# Patient Record
Sex: Female | Born: 1945 | Race: White | Hispanic: No | Marital: Married | Smoking: Never smoker
Health system: Southern US, Community
[De-identification: ages and names within clinical notes are randomized; demographics above are authoritative.]

---

## 2000-07-29 ENCOUNTER — Other Ambulatory Visit: Admission: RE | Admit: 2000-07-29 | Discharge: 2000-07-29 | Payer: Self-pay | Admitting: Podiatry

## 2001-01-07 ENCOUNTER — Encounter: Payer: Self-pay | Admitting: Obstetrics and Gynecology

## 2001-01-07 ENCOUNTER — Encounter: Admission: RE | Admit: 2001-01-07 | Discharge: 2001-01-07 | Payer: Self-pay | Admitting: Obstetrics and Gynecology

## 2001-01-12 ENCOUNTER — Encounter: Payer: Self-pay | Admitting: Obstetrics and Gynecology

## 2001-01-12 ENCOUNTER — Encounter: Admission: RE | Admit: 2001-01-12 | Discharge: 2001-01-12 | Payer: Self-pay | Admitting: Obstetrics and Gynecology

## 2001-01-27 ENCOUNTER — Encounter: Admission: RE | Admit: 2001-01-27 | Discharge: 2001-01-27 | Payer: Self-pay | Admitting: Obstetrics and Gynecology

## 2001-01-27 ENCOUNTER — Encounter: Payer: Self-pay | Admitting: Obstetrics and Gynecology

## 2001-09-17 ENCOUNTER — Encounter: Admission: RE | Admit: 2001-09-17 | Discharge: 2001-09-17 | Payer: Self-pay | Admitting: Orthopaedic Surgery

## 2001-09-17 ENCOUNTER — Encounter: Payer: Self-pay | Admitting: Orthopaedic Surgery

## 2002-01-13 ENCOUNTER — Encounter: Payer: Self-pay | Admitting: Obstetrics and Gynecology

## 2002-01-13 ENCOUNTER — Encounter: Admission: RE | Admit: 2002-01-13 | Discharge: 2002-01-13 | Payer: Self-pay | Admitting: Obstetrics and Gynecology

## 2003-01-17 ENCOUNTER — Encounter: Payer: Self-pay | Admitting: Obstetrics and Gynecology

## 2003-01-17 ENCOUNTER — Encounter: Admission: RE | Admit: 2003-01-17 | Discharge: 2003-01-17 | Payer: Self-pay | Admitting: Obstetrics and Gynecology

## 2004-01-18 ENCOUNTER — Encounter: Admission: RE | Admit: 2004-01-18 | Discharge: 2004-01-18 | Payer: Self-pay | Admitting: Obstetrics and Gynecology

## 2005-01-16 ENCOUNTER — Encounter: Admission: RE | Admit: 2005-01-16 | Discharge: 2005-01-16 | Payer: Self-pay | Admitting: Obstetrics and Gynecology

## 2006-01-14 ENCOUNTER — Encounter: Admission: RE | Admit: 2006-01-14 | Discharge: 2006-01-14 | Payer: Self-pay

## 2007-01-13 ENCOUNTER — Encounter: Admission: RE | Admit: 2007-01-13 | Discharge: 2007-01-13 | Payer: Self-pay | Admitting: Unknown Physician Specialty

## 2008-01-17 ENCOUNTER — Encounter: Admission: RE | Admit: 2008-01-17 | Discharge: 2008-01-17 | Payer: Self-pay | Admitting: Obstetrics and Gynecology

## 2009-01-11 ENCOUNTER — Encounter: Admission: RE | Admit: 2009-01-11 | Discharge: 2009-01-11 | Payer: Self-pay | Admitting: Obstetrics and Gynecology

## 2010-01-10 ENCOUNTER — Encounter: Admission: RE | Admit: 2010-01-10 | Discharge: 2010-01-10 | Payer: Self-pay | Admitting: Obstetrics and Gynecology

## 2011-01-21 ENCOUNTER — Other Ambulatory Visit: Payer: Self-pay | Admitting: Obstetrics and Gynecology

## 2011-01-21 DIAGNOSIS — Z1231 Encounter for screening mammogram for malignant neoplasm of breast: Secondary | ICD-10-CM

## 2011-01-27 ENCOUNTER — Ambulatory Visit
Admission: RE | Admit: 2011-01-27 | Discharge: 2011-01-27 | Disposition: A | Discharge status: Home / Self Care | Payer: Medicare Other | Source: Ambulatory Visit | Attending: Obstetrics and Gynecology | Admitting: Obstetrics and Gynecology

## 2011-01-27 DIAGNOSIS — Z1231 Encounter for screening mammogram for malignant neoplasm of breast: Secondary | ICD-10-CM

## 2012-01-28 ENCOUNTER — Other Ambulatory Visit: Payer: Self-pay | Admitting: Obstetrics and Gynecology

## 2012-01-28 DIAGNOSIS — Z1231 Encounter for screening mammogram for malignant neoplasm of breast: Secondary | ICD-10-CM

## 2012-02-17 ENCOUNTER — Ambulatory Visit
Admission: RE | Admit: 2012-02-17 | Discharge: 2012-02-17 | Disposition: A | Discharge status: Home / Self Care | Payer: Medicare Other | Source: Ambulatory Visit | Attending: Obstetrics and Gynecology | Admitting: Obstetrics and Gynecology

## 2012-02-17 DIAGNOSIS — Z1231 Encounter for screening mammogram for malignant neoplasm of breast: Secondary | ICD-10-CM

## 2013-08-31 ENCOUNTER — Other Ambulatory Visit: Payer: Self-pay

## 2013-08-31 DIAGNOSIS — Z1231 Encounter for screening mammogram for malignant neoplasm of breast: Secondary | ICD-10-CM

## 2013-09-23 ENCOUNTER — Ambulatory Visit
Admission: RE | Admit: 2013-09-23 | Discharge: 2013-09-23 | Disposition: A | Discharge status: Home / Self Care | Payer: Self-pay | Source: Ambulatory Visit

## 2013-09-23 DIAGNOSIS — Z1231 Encounter for screening mammogram for malignant neoplasm of breast: Secondary | ICD-10-CM

## 2017-05-15 ENCOUNTER — Ambulatory Visit (INDEPENDENT_AMBULATORY_CARE_PROVIDER_SITE_OTHER): Payer: Medicare Other | Admitting: Sports Medicine

## 2017-05-15 ENCOUNTER — Ambulatory Visit (INDEPENDENT_AMBULATORY_CARE_PROVIDER_SITE_OTHER): Payer: Medicare Other

## 2017-05-15 DIAGNOSIS — M722 Plantar fascial fibromatosis: Secondary | ICD-10-CM

## 2017-05-15 DIAGNOSIS — M25461 Effusion, right knee: Secondary | ICD-10-CM

## 2017-05-15 DIAGNOSIS — M67471 Ganglion, right ankle and foot: Secondary | ICD-10-CM | POA: Diagnosis not present

## 2017-05-15 DIAGNOSIS — M7041 Prepatellar bursitis, right knee: Secondary | ICD-10-CM | POA: Insufficient documentation

## 2017-05-15 DIAGNOSIS — M25561 Pain in right knee: Secondary | ICD-10-CM | POA: Diagnosis not present

## 2017-05-15 MED ORDER — MELOXICAM 15 MG PO TABS: ORAL_TABLET | ORAL | 3 refills | Status: DC

## 2017-05-15 NOTE — Assessment & Plan Note (Signed)
Aspiration and injection. Strapped with compressive dressing. Return in 1 month for this.

## 2017-05-15 NOTE — Progress Notes (Signed)
Subjective:    I'm seeing this patient as a consultation for: Dr. Cathrine MusterHolly Ivey  CC: Right knee pain, ankle pain, foot pain  HPI: Right knee pain: Larey SeatFell about a month ago, had some pain that is improving over the anterior aspect of her patella.  Only minimal swelling, no other issues.  Right foot pain: Localized on the plantar aspect of the heel, present for months to years, worse with the first few steps in the morning, severe, persistent.  No treatment done yet.  Right foot swelling: Localized over the dorsum, history of ganglion cyst removal in the distant past, now has another ganglion cyst somewhat proximal.  It is fairly bothersome with pain when lacing her shoes.  Past medical history, Surgical history, Family history not pertinant except as noted below, Social history, Allergies, and medications have been entered into the medical record, reviewed, and no changes needed.   Review of Systems: No headache, visual changes, nausea, vomiting, diarrhea, constipation, dizziness, abdominal pain, skin rash, fevers, chills, night sweats, weight loss, swollen lymph nodes, body aches, joint swelling, muscle aches, chest pain, shortness of breath, mood changes, visual or auditory hallucinations.   Objective:   General: Well Developed, well nourished, and in no acute distress.  Neuro:  Extra-ocular muscles intact, able to move all 4 extremities, sensation grossly intact.  Deep tendon reflexes tested were normal. Psych: Alert and oriented, mood congruent with affect. ENT:  Ears and nose appear unremarkable.  Hearing grossly normal. Neck: Unremarkable overall appearance, trachea midline.  No visible thyroid enlargement. Eyes: Conjunctivae and lids appear unremarkable.  Pupils equal and round. Skin: Warm and dry, no rashes noted.  Cardiovascular: Pulses palpable, no extremity edema. Right knee: Slight swelling with tenderness over the prepatellar bursa ROM normal in flexion and extension and lower  leg rotation. Ligaments with solid consistent endpoints including ACL, PCL, LCL, MCL. Negative Mcmurray's and provocative meniscal tests. Non painful patellar compression. Patellar and quadriceps tendons unremarkable. Hamstring and quadriceps strength is normal. Right ankle: No visible erythema or swelling. Palpable dorsal ganglion cyst overlying the talonavicular joint Range of motion is full in all directions. Strength is 5/5 in all directions. Stable lateral and medial ligaments; squeeze test and kleiger test unremarkable; Talar dome nontender; No pain at base of 5th MT; No tenderness over cuboid; No tenderness over N spot or navicular prominence No tenderness on posterior aspects of lateral and medial malleolus No sign of peroneal tendon subluxations; Negative tarsal tunnel tinel's Able to walk 4 steps. Right foot: No visible erythema or swelling. Range of motion is full in all directions. Strength is 5/5 in all directions. No hallux valgus. No pes cavus or pes planus. No abnormal callus noted. No pain over the navicular prominence, or base of fifth metatarsal. Severe tenderness to palpation of the calcaneal insertion of plantar fascia. No pain at the Achilles insertion. No pain over the calcaneal bursa. No pain of the retrocalcaneal bursa. No tenderness to palpation over the tarsals, metatarsals, or phalanges. No hallux rigidus or limitus. No tenderness palpation over interphalangeal joints. No pain with compression of the metatarsal heads. Neurovascularly intact distally.  Procedure: Real-time Ultrasound Guided aspiration/injection of right talonavicular ganglion cyst Device: GE Logiq E  Verbal informed consent obtained.  Time-out conducted.  Noted no overlying erythema, induration, or other signs of local infection.  Skin prepped in a sterile fashion.  Local anesthesia: Topical Ethyl chloride.  With sterile technique and under real time ultrasound guidance:  Identified the course of the dorsalis  pedis, ganglion cyst lies lateral, using an 18-gauge needle advanced into the cyst and aspirated a scant amount of thick yellowish fluid, syringe switched and 1/2 cc Kenalog 40, 1/2 cc lidocaine injected easily Completed without difficulty  Pain immediately resolved suggesting accurate placement of the medication.  Advised to call if fevers/chills, erythema, induration, drainage, or persistent bleeding.  Images permanently stored and available for review in the ultrasound unit.  Impression: Technically successful ultrasound guided injection.  Foot and ankle were then strapped with a compressive dressing.  Impression and Recommendations:   This case required medical decision making of moderate complexity.  Prepatellar bursitis, right knee After minor trauma, x-rays, NSAIDs, time.  Ganglion cyst of right foot Aspiration and injection. Strapped with compressive dressing. Return in 1 month for this.  Plantar fasciitis, right Rehab exercises, meloxicam, return for custom molded orthotics.  ___________________________________________ Ihor Austin. Benjamin Stain, M.D., ABFM., CAQSM. Primary Care and Sports Medicine [facility]  Adjunct Instructor of Family Medicine  [employer]

## 2017-05-15 NOTE — Assessment & Plan Note (Signed)
After minor trauma, x-rays, NSAIDs, time.

## 2017-05-15 NOTE — Assessment & Plan Note (Signed)
Rehab exercises, meloxicam, return for custom molded orthotics.

## 2017-05-19 ENCOUNTER — Ambulatory Visit (INDEPENDENT_AMBULATORY_CARE_PROVIDER_SITE_OTHER): Payer: Medicare Other | Admitting: Sports Medicine

## 2017-05-19 ENCOUNTER — Encounter: Payer: Self-pay | Admitting: Sports Medicine

## 2017-05-19 DIAGNOSIS — M722 Plantar fascial fibromatosis: Secondary | ICD-10-CM | POA: Diagnosis not present

## 2017-05-19 DIAGNOSIS — M67471 Ganglion, right ankle and foot: Secondary | ICD-10-CM

## 2017-05-19 MED ORDER — DICLOFENAC SODIUM 75 MG PO TBEC: 75.0000 mg | DELAYED_RELEASE_TABLET | Freq: Two times a day (BID) | ORAL | 3 refills | Status: AC

## 2017-05-19 NOTE — Assessment & Plan Note (Signed)
Resolved after aspiration and injection at the talonavicular joint at the last visit.

## 2017-05-19 NOTE — Assessment & Plan Note (Signed)
Custom orthotics as above. 

## 2017-05-19 NOTE — Progress Notes (Signed)
    Patient was fitted for a : standard, cushioned, semi-rigid orthotic. The orthotic was heated and afterward the patient stood on the orthotic blank positioned on the orthotic stand. The patient was positioned in subtalar neutral position and 10 degrees of ankle dorsiflexion in a weight bearing stance. After completion of molding, a stable base was applied to the orthotic blank. The blank was ground to a stable position for weight bearing. Size: 8 Base: White EVA Additional Posting and Padding: None The patient ambulated these, and they were very comfortable.  I spent 40 minutes with this patient, greater than 50% was face-to-face time counseling regarding the below diagnosis.  ___________________________________________ Jasean Ambrosia J. Kursten Kruk, M.D., ABFM., CAQSM. Primary Care and Sports Medicine [facility]  Adjunct Instructor of Family Medicine  [employer]   

## 2017-06-16 ENCOUNTER — Encounter: Payer: Self-pay | Admitting: Sports Medicine

## 2017-06-16 ENCOUNTER — Ambulatory Visit: Payer: Medicare Other | Admitting: Sports Medicine

## 2017-06-16 DIAGNOSIS — M7041 Prepatellar bursitis, right knee: Secondary | ICD-10-CM

## 2017-06-16 DIAGNOSIS — M1711 Unilateral primary osteoarthritis, right knee: Secondary | ICD-10-CM | POA: Diagnosis not present

## 2017-06-16 DIAGNOSIS — M67471 Ganglion, right ankle and foot: Secondary | ICD-10-CM | POA: Diagnosis not present

## 2017-06-16 DIAGNOSIS — M722 Plantar fascial fibromatosis: Secondary | ICD-10-CM

## 2017-06-16 NOTE — Assessment & Plan Note (Signed)
Persistent in spite of Voltaren, custom orthotics, rehab exercises. Adding the air heel brace, if still painful in a month we will proceed with injection.

## 2017-06-16 NOTE — Assessment & Plan Note (Signed)
Completely resolved after dorsal talonavicular aspiration and injection.

## 2017-06-16 NOTE — Assessment & Plan Note (Addendum)
Prepatellar bursitis pain has resolved, now complaining of some pain at the medial joint line. Continue Voltaren, we did discuss injection treatments that may become an option in the future, no changes in plan. Rehab exercises given.

## 2017-06-16 NOTE — Assessment & Plan Note (Signed)
Resolved

## 2017-06-16 NOTE — Progress Notes (Signed)
   Subjective:    I'm seeing this patient as a consultation for: Dr. Cathrine MusterHolly Ivey  CC: Right knee pain  HPI: This is a pleasant 71 year old female, she has had right knee pain for some time now, I diagnosed her with a prepatellar bursitis about a month ago, this has resolved, she now complains of pain at the medial joint line, with gelling, no mechanical symptoms, moderate, persistent without radiation.  Prepatellar bursitis: Resolved.  Plantar fasciitis: Persistent pain, moderate, localized to the calcaneal insertion of the plantar fascia..  Dorsal talonavicular ganglion cyst: Continues to be resolved after aspiration and injection.  Past medical history, Surgical history, Family history not pertinant except as noted below, Social history, Allergies, and medications have been entered into the medical record, reviewed, and no changes needed.   Review of Systems: No headache, visual changes, nausea, vomiting, diarrhea, constipation, dizziness, abdominal pain, skin rash, fevers, chills, night sweats, weight loss, swollen lymph nodes, body aches, joint swelling, muscle aches, chest pain, shortness of breath, mood changes, visual or auditory hallucinations.   Objective:   General: Well Developed, well nourished, and in no acute distress.  Neuro:  Extra-ocular muscles intact, able to move all 4 extremities, sensation grossly intact.  Deep tendon reflexes tested were normal. Psych: Alert and oriented, mood congruent with affect. ENT:  Ears and nose appear unremarkable.  Hearing grossly normal. Neck: Unremarkable overall appearance, trachea midline.  No visible thyroid enlargement. Eyes: Conjunctivae and lids appear unremarkable.  Pupils equal and round. Skin: Warm and dry, no rashes noted.  Cardiovascular: Pulses palpable, no extremity edema. Right knee: Normal to inspection with no erythema or effusion or obvious bony abnormalities. Tender to palpation at the medial joint line ROM normal in  flexion and extension and lower leg rotation. Ligaments with solid consistent endpoints including ACL, PCL, LCL, MCL. Negative Mcmurray's and provocative meniscal tests. Non painful patellar compression. Patellar and quadriceps tendons unremarkable. Hamstring and quadriceps strength is normal. Right foot: No visible erythema or swelling. Range of motion is full in all directions. Strength is 5/5 in all directions. No hallux valgus. Pes cavus No abnormal callus noted. No pain over the navicular prominence, or base of fifth metatarsal. Moderate tenderness to palpation of the calcaneal insertion of plantar fascia. No pain at the Achilles insertion. No pain over the calcaneal bursa. No pain of the retrocalcaneal bursa. No tenderness to palpation over the tarsals, metatarsals, or phalanges. No hallux rigidus or limitus. No tenderness palpation over interphalangeal joints. No pain with compression of the metatarsal heads. Neurovascularly intact distally.  Impression and Recommendations:   This case required medical decision making of moderate complexity.  Ganglion cyst of right foot Completely resolved after dorsal talonavicular aspiration and injection.  Plantar fasciitis, right Persistent in spite of Voltaren, custom orthotics, rehab exercises. Adding the air heel brace, if still painful in a month we will proceed with injection.  Prepatellar bursitis, right knee Resolved.  Primary osteoarthritis of right knee Prepatellar bursitis pain has resolved, now complaining of some pain at the medial joint line. Continue Voltaren, we did discuss injection treatments that may become an option in the future, no changes in plan. Rehab exercises given.   ___________________________________________ Ihor Austinhomas J. Benjamin Stainhekkekandam, M.D., ABFM., CAQSM. Primary Care and Sports Medicine [facility]  Adjunct Instructor of Family Medicine  [employer]

## 2017-07-10 ENCOUNTER — Ambulatory Visit: Payer: Medicare Other | Admitting: Sports Medicine

## 2017-07-10 ENCOUNTER — Encounter: Payer: Self-pay | Admitting: Sports Medicine

## 2017-07-10 DIAGNOSIS — M1711 Unilateral primary osteoarthritis, right knee: Secondary | ICD-10-CM | POA: Diagnosis not present

## 2017-07-10 DIAGNOSIS — M722 Plantar fascial fibromatosis: Secondary | ICD-10-CM

## 2017-07-10 NOTE — Assessment & Plan Note (Signed)
Prepatellar bursitis pain had resolved months ago, medial joint line pain has also resolved with Voltaren, no injections needed.

## 2017-07-10 NOTE — Assessment & Plan Note (Signed)
Persistent pain in spite of Voltaren, custom orthotics, rehab exercises, air heel brace. Proceeding with injection. Return in 1 month.

## 2017-07-10 NOTE — Progress Notes (Signed)
Subjective:    CC: Foot pain  HPI: Mariah Montgomery returns, everything is gotten better with the exception of her right foot, we diagnosed her with plantar fasciitis, she is done physical therapy, custom orthotics, we finally did the air heel brace at the last visit which did provide about 50% relief but she continues to have discomfort at the calcaneal origin of the plantar fascia, severe, persistent, localized without radiation.  Past medical history:  Negative.  See flowsheet/record as well for more information.  Surgical history: Negative.  See flowsheet/record as well for more information.  Family history: Negative.  See flowsheet/record as well for more information.  Social history: Negative.  See flowsheet/record as well for more information.  Allergies, and medications have been entered into the medical record, reviewed, and no changes needed.   (To billers/coders, pertinent past medical, social, surgical, family history can be found in problem list, if problem list is marked as reviewed then this indicates that past medical, social, surgical, family history was also reviewed)  Review of Systems: No fevers, chills, night sweats, weight loss, chest pain, or shortness of breath.   Objective:    General: Well Developed, well nourished, and in no acute distress.  Neuro: Alert and oriented x3, extra-ocular muscles intact, sensation grossly intact.  HEENT: Normocephalic, atraumatic, pupils equal round reactive to light, neck supple, no masses, no lymphadenopathy, thyroid nonpalpable.  Skin: Warm and dry, no rashes. Cardiac: Regular rate and rhythm, no murmurs rubs or gallops, no lower extremity edema.  Respiratory: Clear to auscultation bilaterally. Not using accessory muscles, speaking in full sentences. Right foot: No visible erythema or swelling. Range of motion is full in all directions. Strength is 5/5 in all directions. No hallux valgus. No pes cavus or pes planus. No abnormal callus  noted. No pain over the navicular prominence, or base of fifth metatarsal. Moderate tenderness to palpation of the calcaneal insertion of plantar fascia. No pain at the Achilles insertion. No pain over the calcaneal bursa. No pain of the retrocalcaneal bursa. No tenderness to palpation over the tarsals, metatarsals, or phalanges. No hallux rigidus or limitus. No tenderness palpation over interphalangeal joints. No pain with compression of the metatarsal heads. Neurovascularly intact distally.  Procedure: Real-time Ultrasound Guided Injection of right plantar fascial origin Device: GE Logiq E  Verbal informed consent obtained.  Time-out conducted.  Noted no overlying erythema, induration, or other signs of local infection.  Skin prepped in a sterile fashion.  Local anesthesia: Topical Ethyl chloride.  With sterile technique and under real time ultrasound guidance: Noted thickened proximal plantar fascia on ultrasound, I guided a 25-gauge needle just deep to the calcaneal origin and then injected 1 cc kenalog 40, 1 cc of lidocaine, 1 cc bupivacaine. Completed without difficulty  Pain immediately resolved suggesting accurate placement of the medication.  Advised to call if fevers/chills, erythema, induration, drainage, or persistent bleeding.  Images permanently stored and available for review in the ultrasound unit.  Impression: Technically successful ultrasound guided injection.  Impression and Recommendations:    Plantar fasciitis, right Persistent pain in spite of Voltaren, custom orthotics, rehab exercises, air heel brace. Proceeding with injection. Return in 1 month.  Primary osteoarthritis of right knee Prepatellar bursitis pain had resolved months ago, medial joint line pain has also resolved with Voltaren, no injections needed. ___________________________________________ Ihor Austinhomas J. Benjamin Stainhekkekandam, M.D., ABFM., CAQSM. Primary Care and Sports Medicine [facility]  Adjunct Instructor of Family Medicine  [employer]

## 2017-08-07 ENCOUNTER — Encounter: Payer: Self-pay | Admitting: Sports Medicine

## 2017-08-07 ENCOUNTER — Ambulatory Visit: Payer: Medicare Other | Admitting: Sports Medicine

## 2017-08-07 DIAGNOSIS — M722 Plantar fascial fibromatosis: Secondary | ICD-10-CM

## 2017-08-07 NOTE — Assessment & Plan Note (Signed)
Overall doing well, she was having persistent pain in spite of Voltaren, custom orthotics, rehab exercises in the air heel brace. We did an injection at the last visit which provided good relief, still has some discomfort around the rim of the calcaneus but this resolves when she wears the air heel brace. I do not think we need any more major interventions, advised to avoid barefoot walking, continue the air heel brace and the rehab exercises and return to see me as needed.

## 2017-08-07 NOTE — Progress Notes (Signed)
  Subjective:    CC: Recheck heel  HPI: Plantar fasciitis: Pain for the most part resolved after injection at the last visit.  I reviewed the past medical history, family history, social history, surgical history, and allergies today and no changes were needed.  Please see the problem list section below in epic for further details.  Past Medical History: No past medical history on file. Past Surgical History: History reviewed. No pertinent surgical history. Social History: Social History   Socioeconomic History  . Marital status: Married    Spouse name: None  . Number of children: None  . Years of education: None  . Highest education level: None  Social Needs  . Financial resource strain: None  . Food insecurity - worry: None  . Food insecurity - inability: None  . Transportation needs - medical: None  . Transportation needs - non-medical: None  Occupational History  . None  Tobacco Use  . Smoking status: Never Smoker  . Smokeless tobacco: Never Used  Substance and Sexual Activity  . Alcohol use: None  . Drug use: None  . Sexual activity: None  Other Topics Concern  . None  Social History Narrative  . None   Family History: No family history on file. Allergies: Allergies  Allergen Reactions  . Meloxicam Rash   Medications: See med rec.  Review of Systems: No fevers, chills, night sweats, weight loss, chest pain, or shortness of breath.   Objective:    General: Well Developed, well nourished, and in no acute distress.  Neuro: Alert and oriented x3, extra-ocular muscles intact, sensation grossly intact.  HEENT: Normocephalic, atraumatic, pupils equal round reactive to light, neck supple, no masses, no lymphadenopathy, thyroid nonpalpable.  Skin: Warm and dry, no rashes. Cardiac: Regular rate and rhythm, no murmurs rubs or gallops, no lower extremity edema.  Respiratory: Clear to auscultation bilaterally. Not using accessory muscles, speaking in full  sentences.  Impression and Recommendations:    Plantar fasciitis, right Overall doing well, she was having persistent pain in spite of Voltaren, custom orthotics, rehab exercises in the air heel brace. We did an injection at the last visit which provided good relief, still has some discomfort around the rim of the calcaneus but this resolves when she wears the air heel brace. I do not think we need any more major interventions, advised to avoid barefoot walking, continue the air heel brace and the rehab exercises and return to see me as needed. ___________________________________________ Ihor Austinhomas J. Benjamin Stainhekkekandam, M.D., ABFM., CAQSM. Primary Care and Sports Medicine [facility]  Adjunct Instructor of Family Medicine  [employer]

## 2019-06-03 ENCOUNTER — Encounter: Payer: Self-pay | Admitting: Sports Medicine

## 2019-06-03 ENCOUNTER — Ambulatory Visit (INDEPENDENT_AMBULATORY_CARE_PROVIDER_SITE_OTHER): Payer: Medicare Other | Admitting: Sports Medicine

## 2019-06-03 ENCOUNTER — Other Ambulatory Visit: Payer: Self-pay

## 2019-06-03 ENCOUNTER — Ambulatory Visit (INDEPENDENT_AMBULATORY_CARE_PROVIDER_SITE_OTHER): Payer: Medicare Other

## 2019-06-03 DIAGNOSIS — M47817 Spondylosis without myelopathy or radiculopathy, lumbosacral region: Secondary | ICD-10-CM | POA: Insufficient documentation

## 2019-06-03 DIAGNOSIS — M5416 Radiculopathy, lumbar region: Secondary | ICD-10-CM | POA: Diagnosis not present

## 2019-06-03 MED ORDER — PREDNISONE 50 MG PO TABS: ORAL_TABLET | ORAL | 0 refills | Status: DC

## 2019-06-03 NOTE — Progress Notes (Signed)
Subjective:    CC: Low back pain  HPI: This is a pleasant 73 year old female, she is a long history of on and off low back pain, worse recently, localized in the right side of the low back worse with sitting and flexion, radiation down into the anterolateral right thigh but not really past the knee.  No progressive weakness, no constitutional symptoms, no trauma, no bowel or bladder dysfunction.  She was placed appropriately on gabapentin by her PCP but has not noticed much improvement yet.  I reviewed the past medical history, family history, social history, surgical history, and allergies today and no changes were needed.  Please see the problem list section below in epic for further details.  Past Medical History: No past medical history on file. Past Surgical History: No past surgical history on file. Social History: Social History   Socioeconomic History  . Marital status: Married    Spouse name: Not on file  . Number of children: Not on file  . Years of education: Not on file  . Highest education level: Not on file  Occupational History  . Not on file  Social Needs  . Financial resource strain: Not on file  . Food insecurity    Worry: Not on file    Inability: Not on file  . Transportation needs    Medical: Not on file    Non-medical: Not on file  Tobacco Use  . Smoking status: Never Smoker  . Smokeless tobacco: Never Used  Substance and Sexual Activity  . Alcohol use: Not on file  . Drug use: Not on file  . Sexual activity: Not on file  Lifestyle  . Physical activity    Days per week: Not on file    Minutes per session: Not on file  . Stress: Not on file  Relationships  . Social Herbalist on phone: Not on file    Gets together: Not on file    Attends religious service: Not on file    Active member of club or organization: Not on file    Attends meetings of clubs or organizations: Not on file    Relationship status: Not on file  Other Topics  Concern  . Not on file  Social History Narrative  . Not on file   Family History: No family history on file. Allergies: Allergies  Allergen Reactions  . Meloxicam Rash   Medications: See med rec.  Review of Systems: No fevers, chills, night sweats, weight loss, chest pain, or shortness of breath.   Objective:    General: Well Developed, well nourished, and in no acute distress.  Neuro: Alert and oriented x3, extra-ocular muscles intact, sensation grossly intact.  HEENT: Normocephalic, atraumatic, pupils equal round reactive to light, neck supple, no masses, no lymphadenopathy, thyroid nonpalpable.  Skin: Warm and dry, no rashes. Cardiac: Regular rate and rhythm, no murmurs rubs or gallops, no lower extremity edema.  Respiratory: Clear to auscultation bilaterally. Not using accessory muscles, speaking in full sentences. Back Exam:  Inspection: Unremarkable  Motion: Flexion 45 deg, Extension 45 deg, Side Bending to 45 deg bilaterally,  Rotation to 45 deg bilaterally  SLR laying: Negative  XSLR laying: Negative  Palpable tenderness: None. FABER: negative. Sensory change: Gross sensation intact to all lumbar and sacral dermatomes.  Reflexes: 2+ at both patellar tendons, 2+ at achilles tendons, Babinski's downgoing.  Strength at foot  Plantar-flexion: 5/5 Dorsi-flexion: 5/5 Eversion: 5/5 Inversion: 5/5  Leg strength  Quad: 5/5 Hamstring:  5/5 Hip flexor: 5/5 Hip abductors: 5/5  Gait unremarkable.  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  Impression and Recommendations:    Right lumbar radiculitis Right L4 radiculitis, adding formal physical therapy, prednisone, x-rays. Return to see me in 4 to 6 weeks, MRI for interventional planning if no better.   ___________________________________________ Ihor Austin. Benjamin Stain, M.D., ABFM., CAQSM. Primary Care and Sports Medicine  [facility]  Adjunct Professor of Family Medicine   [employer]

## 2019-06-03 NOTE — Assessment & Plan Note (Signed)
Right L4 radiculitis, adding formal physical therapy, prednisone, x-rays. Return to see me in 4 to 6 weeks, MRI for interventional planning if no better.

## 2019-06-09 ENCOUNTER — Other Ambulatory Visit: Payer: Self-pay

## 2019-06-09 ENCOUNTER — Ambulatory Visit (INDEPENDENT_AMBULATORY_CARE_PROVIDER_SITE_OTHER): Payer: Medicare Other | Admitting: Rehabilitative and Restorative Service Providers"

## 2019-06-09 DIAGNOSIS — M6281 Muscle weakness (generalized): Secondary | ICD-10-CM | POA: Diagnosis not present

## 2019-06-09 DIAGNOSIS — R293 Abnormal posture: Secondary | ICD-10-CM | POA: Diagnosis not present

## 2019-06-09 DIAGNOSIS — M5440 Lumbago with sciatica, unspecified side: Secondary | ICD-10-CM

## 2019-06-09 NOTE — Patient Instructions (Signed)
Access Code: [email or web address]  URL: [email or web address]  Date: 06/09/2019  Prepared by: Rudell Cobb   Exercises Supine ITB Stretch with Strap - 3 reps - 1 sets - 30 secpmds hold - 2x daily - 7x weekly Seated Piriformis Stretch with Trunk Bend - 3 reps - 1 sets - 30 seconds hold - 2x daily - 7x weekly Beginner Front Arm Support - 10 reps - 1 sets - 3 seconds hold - 2x daily - 7x weekly Prone Transversus Abdominus Contraction with Small Hip Extension - 10 reps - 1 sets - 2x daily - 7x weekly

## 2019-06-10 NOTE — Therapy (Signed)
[facility] [address] [place], [place], [address] Phone: [phone]   Fax:  [phone]  Physical Therapy Treatment  Patient Details  Name: Mariah SparBetsy C Montgomery MRN: [id number] Date of Birth: Jun 28, 1946 Referring Provider (PT): Monica Bectonhekkekandam, Thomas J, MD   Encounter Date: 06/09/2019  PT End of Session - 06/09/19 1526    Visit Number  1    Number of Visits  12    Date for PT Re-Evaluation  07/24/19    PT Start Time  1445    PT Stop Time  1530    PT Time Calculation (min)  45 min    Activity Tolerance  Patient tolerated treatment well    Behavior During Therapy  [facility] assessed/performed       No past medical history on file.  No past surgical history on file.  There were no vitals filed for this visit.  Subjective Assessment - 06/09/19 1447    Subjective  The patient began with R low back pain 2 months ago that radiates into the R thigh (lateral to anterior). The pain is R lateral SI region and the numbness is present in thigh and is constant in nature.  Symptoms do not change with walking.    Pertinent History  reviewed in epic    Patient Stated Goals  reduce discomfort    Currently in Pain?  Yes    Pain Score  5     Pain Location  Back    Pain Orientation  Right    Pain Descriptors / Indicators  Aching;Tingling    Pain Type  Acute pain    Pain Radiating Towards  into R thigh    Pain Onset  More than a month ago    Pain Frequency  Constant    Aggravating Factors   clothes rubbing R thigh would aggravate    Pain Relieving Factors  nothing reduces tingling         [facility] PT Assessment - 06/09/19 1442      Assessment   Medical Diagnosis  M54.16 (ICD-10-CM) - Right lumbar radiculitis    Referring Provider (PT)  Monica Bectonhekkekandam, Thomas J, MD    Onset Date/Surgical Date  --   8 weeks ago   Prior Therapy  none      Precautions   Precautions  None      Restrictions   Weight Bearing Restrictions  No       Balance Screen   Has the patient fallen in the past 6 months  No    Has the patient had a decrease in activity level because of a fear of falling?   No    Is the patient reluctant to leave their home because of a fear of falling?   No      Home Public house managernvironment   Living Environment  Private residence    Living Arrangements  Spouse/significant other    Home Layout  Two level      Prior Function   Level of Independence  Independent    Leisure  exercise, spends time with family      Observation/Other Assessments   Focus on Therapeutic Outcomes (FOTO)   83%      Sensation   Light Touch  Impaired Detail    Light Touch Impaired Details  Impaired RLE   L3 distribution     Posture/Postural Control   Posture/Postural Control  Postural limitations    Postural Limitations  Increased lumbar lordosis  ROM / Strength   AROM / PROM / Strength  AROM;Strength      AROM   Overall AROM   Within functional limits for tasks performed      Strength   Overall Strength  Deficits    Strength Assessment Site  Hip;Ankle;Knee    Right/Left Hip  Right;Left    Right Hip Flexion  4-/5    Right Hip Extension  4/5    Right Hip ABduction  5/5    Left Hip Flexion  5/5    Left Hip Extension  5/5    Left Hip ABduction  5/5    Right/Left Knee  Right;Left    Right Knee Flexion  5/5    Right Knee Extension  5/5    Left Knee Flexion  5/5    Left Knee Extension  5/5    Right/Left Ankle  Right;Left    Right Ankle Dorsiflexion  4/5    Left Ankle Dorsiflexion  4/5      Flexibility   Soft Tissue Assessment /Muscle Length  yes    Quadriceps  discomfort in R hip and hamstring with quadriceps overpressure    ITB  tightness noted R with pain reproduced R SI region      Palpation   Palpation comment  tender along lateral border of the right SI joint and R PSIS      Special Tests    Special Tests  Lumbar;Sacrolliac Tests;Hip Special Tests    Lumbar Tests  FABER test    Sacroiliac Tests   Sacral  Compression    Hip Special Tests   Hip Scouring      FABER test   findings  Positive    Side  Right    Comment  creates R SI joint pain      Pelvic Dictraction   Findings  Negative      Sacral Compression   Findings  Positive    Side   Right    Comments  pain with compression      Hip Scouring   Findings  Negative    Side  Right;Left                   [facility] Adult PT Treatment/Exercise - 06/09/19 1442      Exercises   Exercises  Lumbar;Knee/Hip      Lumbar Exercises: Stretches   ITB Stretch  1 rep;30 seconds;Left;Right    Piriformis Stretch  2 reps;30 seconds      Lumbar Exercises: Prone   Straight Leg Raise  10 reps;3 seconds    Straight Leg Raises Limitations  with transverse abdominus contraction before lifting      Lumbar Exercises: Quadruped   Straight Leg Raise  10 reps             PT Education - 06/09/19 1524    Education Details  HEP    Person(s) Educated  Patient    Methods  Explanation;Demonstration;Handout    Comprehension  Verbalized understanding;Returned demonstration          PT Long Term Goals - 06/09/19 1830      PT LONG TERM GOAL #1   Title  The patient will be independent with HEP.    Time  6    Period  Weeks    Target Date  07/24/19      PT LONG TERM GOAL #2   Title  The patient will maintain functinal limitation at < or equal to 17% limitation (per  FOTO).    Time  6    Period  Weeks    Target Date  07/24/19      PT LONG TERM GOAL #3   Title  The patient will report centralizing of symptoms from R LE to low back to reduce radicular symptoms.    Time  6    Period  Weeks    Target Date  07/24/19      PT LONG TERM GOAL #4   Title  The patient will  improve R hip flexion to 5/5 without pain for improved functional strength.    Time  6    Period  Weeks    Target Date  07/25/19            Plan - 06/10/19 0915    Clinical Impression Statement  The patient is a 73 yo female presenting to OP physical  therapy with onset of R low back pain with radicular symptoms along L3-L4 pattern that does not radiate beyond the knee.  She experiences pain in the low back and tingling in the R thigh lateral to anterior.  She presents with R sided low back pain, decreased R hip strength, weakness in core with hip extension noted, pain along R SI border, and tightness in IT Band and piriformis.  PT to address deficits to optimize functional mobility.    Examination-Activity Limitations  Lift;Locomotion Level    Stability/Clinical Decision Making  Stable/Uncomplicated    Clinical Decision Making  Low    Rehab Potential  Good    PT Frequency  2x / week    PT Duration  6 weeks    PT Treatment/Interventions  ADLs/Self Care Home Management;Gait training;Stair training;Functional mobility training;Therapeutic activities;Therapeutic exercise;Electrical Stimulation;Cryotherapy;Iontophoresis 4mg /ml Dexamethasone;Moist Heat;Traction;Ultrasound;Neuromuscular re-education;Manual techniques;Patient/family education;Orthotic Fit/Training;Dry needling;Passive range of motion;Joint Manipulations;Spinal Manipulations;Taping    PT Next Visit Plan  Progress HEP, work on SI muscle balance/ co-contraction using muscle energy techniques, core strengthening at neutral, R hip strengthening.    PT Home Exercise Plan  Access Code: [email or web address]    Consulted and Agree with Plan of Care  Patient       Patient will benefit from skilled therapeutic intervention in order to improve the following deficits and impairments:  Pain, Postural dysfunction, Decreased strength, Impaired flexibility  Visit Diagnosis: Acute right-sided low back pain with sciatica, sciatica laterality unspecified  Muscle weakness (generalized)  Abnormal posture     Problem List Patient Active Problem List   Diagnosis Date Noted  . Right lumbar radiculitis 06/03/2019  . Primary osteoarthritis of right knee 06/16/2017  . Prepatellar bursitis, right knee 05/15/2017   . Ganglion cyst of right foot 05/15/2017  . Plantar fasciitis, right 05/15/2017    June Vacha, PT 06/10/2019, 9:20 AM  [facility] [address] [place], [place], [place] Phone: [phone]   Fax:  [phone]  Name: CAITLINN KLINKER MRN: Mariah Montgomery Date of Birth: 04/07/1946

## 2019-06-21 ENCOUNTER — Other Ambulatory Visit: Payer: Self-pay

## 2019-06-21 ENCOUNTER — Ambulatory Visit (INDEPENDENT_AMBULATORY_CARE_PROVIDER_SITE_OTHER): Payer: Medicare Other | Admitting: Physical Therapy

## 2019-06-21 ENCOUNTER — Encounter: Payer: Self-pay | Admitting: Physical Therapy

## 2019-06-21 DIAGNOSIS — R293 Abnormal posture: Secondary | ICD-10-CM | POA: Diagnosis not present

## 2019-06-21 DIAGNOSIS — M5440 Lumbago with sciatica, unspecified side: Secondary | ICD-10-CM

## 2019-06-21 DIAGNOSIS — M6281 Muscle weakness (generalized): Secondary | ICD-10-CM | POA: Diagnosis not present

## 2019-06-21 NOTE — Therapy (Signed)
[place], [place], [address] Phone: [phone]   Fax:  [phone]  Physical Therapy Treatment  Patient Details  Name: Mariah Montgomery MRN: [id number] Date of Birth: 1945/12/03 Referring Provider (PT): Silverio Decamp, MD   Encounter Date: 06/21/2019  PT End of Session - 06/21/19 0850    Visit Number  2    Number of Visits  12    Date for PT Re-Evaluation  07/24/19    PT Start Time  0851    PT Stop Time  0930    PT Time Calculation (min)  39 min    Activity Tolerance  Patient tolerated treatment well    Behavior During Therapy  [facility] for tasks assessed/performed       History reviewed. No pertinent past medical history.  History reviewed. No pertinent surgical history.  There were no vitals filed for this visit.  Subjective Assessment - 06/21/19 0852    Subjective  Pt states her thigh doesn't hurt like it did prior to her last treatment session. She feels the stretches have made a difference in her pain.    Currently in Pain?  Yes    Pain Score  5     Pain Location  Back    Pain Orientation  Right    Pain Descriptors / Indicators  Sore    Pain Type  Acute pain    Multiple Pain Sites  Yes    Pain Score  5    Pain Location  Leg    Pain Orientation  Right    Pain Descriptors / Indicators  Sore         [facility] PT Assessment - 06/21/19 0001      Assessment   Medical Diagnosis  M54.16 (ICD-10-CM) - Right lumbar radiculitis    Referring Provider (PT)  Silverio Decamp, MD    Onset Date/Surgical Date  --   8 weeks ago   Prior Therapy  none      Palpation   SI assessment   Rt ASIS lower than Lt     Palpation comment  Tightness / tenderness along Rt lateral quad.        [place] Adult PT Treatment/Exercise - 06/21/19 0001      Self-Care   Self-Care  Other Self-Care Comments    Other Self-Care Comments   Pt educated on self massage with a ball to lumbar spine and a rolling  pin to Rt ITB, quads, hamstrings; pt verbalized understanding and returned demo      Lumbar Exercises: Stretches   Passive Hamstring Stretch  Right;2 reps;30 seconds   supine with strap      Lumbar Exercises: Aerobic   Nustep  L5 x 5 min      Lumbar Exercises: Supine   Ab Set  5 reps;5 seconds    Bridge  10 reps;3 seconds      Lumbar Exercises: Prone   Straight Leg Raise  15 reps   alternating Rt/Lt LE   Straight Leg Raises Limitations  with transverse abdominus contraction before lifting      Lumbar Exercises: Quadruped   Straight Leg Raise  --   12   Straight Leg Raises Limitations  alternating Rt/Lt LE      Knee/Hip Exercises: Stretches   Sports administrator  Right;2 reps;30 seconds   prone with strap    ITB Stretch  Right;2 reps;30 seconds    ITB Stretch Limitations  cues for form  Piriformis Stretch  Right;2 reps;30 seconds    Piriformis Stretch Limitations  sitting       Manual Therapy   Manual Therapy  Soft tissue mobilization;Myofascial release    Soft tissue mobilization  STM to Rt lateral quad     Myofascial Release  MFR to distal Rt lateral quad         PT Long Term Goals - 06/09/19 1830      PT LONG TERM GOAL #1   Title  The patient will be independent with HEP.    Time  6    Period  Weeks    Target Date  07/24/19      PT LONG TERM GOAL #2   Title  The patient will maintain functinal limitation at < or equal to 17% limitation (per FOTO).    Time  6    Period  Weeks    Target Date  07/24/19      PT LONG TERM GOAL #3   Title  The patient will report centralizing of symptoms from R LE to low back to reduce radicular symptoms.    Time  6    Period  Weeks    Target Date  07/24/19      PT LONG TERM GOAL #4   Title  The patient will  improve R hip flexion to 5/5 without pain for improved functional strength.    Time  6    Period  Weeks    Target Date  07/25/19            Plan - 06/21/19 1155    Clinical Impression Statement  Pt reports feeling  better since starting therapy; less shooting pains in Rt LE. Pt tolerated exercises well with no reports of increased pain. Pt required cues for form during quadraped exercise and ITB stretch. Pt making progress toward LTG # 3; will continue to benefit from continued skilled PT for improved functional mobility.    Examination-Activity Limitations  Lift;Locomotion Level    Stability/Clinical Decision Making  Stable/Uncomplicated    Rehab Potential  Good    PT Frequency  2x / week    PT Duration  6 weeks    PT Treatment/Interventions  ADLs/Self Care Home Management;Gait training;Stair training;Functional mobility training;Therapeutic activities;Therapeutic exercise;Electrical Stimulation;Cryotherapy;Iontophoresis 48m/ml Dexamethasone;Moist Heat;Traction;Ultrasound;Neuromuscular re-education;Manual techniques;Patient/family education;Orthotic Fit/Training;Dry needling;Passive range of motion;Joint Manipulations;Spinal Manipulations;Taping    PT Next Visit Plan  Continue to progress core strengthening at neutral, R hip strengthening, and stretches.   Assess alignment of pelvis; MET as indicated.    PT Home Exercise Plan  Access Code: [email or web address]   Consulted and Agree with Plan of Care  Patient       Patient will benefit from skilled therapeutic intervention in order to improve the following deficits and impairments:  Pain, Postural dysfunction, Decreased strength, Impaired flexibility  Visit Diagnosis: Acute right-sided low back pain with sciatica, sciatica laterality unspecified  Muscle weakness (generalized)  Abnormal posture     Problem List Patient Active Problem List   Diagnosis Date Noted  . Right lumbar radiculitis 06/03/2019  . Primary osteoarthritis of right knee 06/16/2017  . Prepatellar bursitis, right knee 05/15/2017  . Ganglion cyst of right foot 05/15/2017  . Plantar fasciitis, right 05/15/2017    KGardiner Rhyme SPTA 06/21/2019, 12:12 PM   JKerin Perna  PTA 06/21/19 1:04 PM   [place] [address]: [phone]  Fax:  [phone] Name: [name] MRN: [id number] Date of Birth: June 13, 1946

## 2019-06-24 ENCOUNTER — Other Ambulatory Visit: Payer: Self-pay

## 2019-06-24 ENCOUNTER — Ambulatory Visit (INDEPENDENT_AMBULATORY_CARE_PROVIDER_SITE_OTHER): Payer: Medicare Other | Admitting: Physical Therapy

## 2019-06-24 ENCOUNTER — Encounter: Payer: Self-pay | Admitting: Physical Therapy

## 2019-06-24 DIAGNOSIS — M5440 Lumbago with sciatica, unspecified side: Secondary | ICD-10-CM | POA: Diagnosis not present

## 2019-06-24 DIAGNOSIS — M6281 Muscle weakness (generalized): Secondary | ICD-10-CM | POA: Diagnosis not present

## 2019-06-24 DIAGNOSIS — R293 Abnormal posture: Secondary | ICD-10-CM

## 2019-06-24 NOTE — Therapy (Signed)
[place], [place], [address] Phone: [phone]   Fax:  [phone]  Physical Therapy Treatment  Patient Details  Name: Mariah Montgomery MRN: [id number] Date of Birth: 1946/01/25 Referring Provider (PT): Silverio Decamp, MD   Encounter Date: 06/24/2019  PT End of Session - 06/24/19 0855    Visit Number  3    Number of Visits  12    Date for PT Re-Evaluation  07/24/19    PT Start Time  8101   Pt arrived late   PT Stop Time  0931    PT Time Calculation (min)  36 min    Activity Tolerance  Patient tolerated treatment well    Behavior During Therapy  [facility] for tasks assessed/performed       History reviewed. No pertinent past medical history.  History reviewed. No pertinent surgical history.  There were no vitals filed for this visit.  Subjective Assessment - 06/24/19 0856    Subjective  Pt states her leg is sore from using the rolling pin in the last treatment session. She still has some numbness but it has improved since starting therapy.    Currently in Pain?  Yes    Pain Score  3     Pain Location  Leg    Pain Orientation  Right    Pain Descriptors / Indicators  Sore         [facility] PT Assessment - 06/24/19 0001      Assessment   Medical Diagnosis  M54.16 (ICD-10-CM) - Right lumbar radiculitis    Referring Provider (PT)  Silverio Decamp, MD    Onset Date/Surgical Date  --   8 weeks ago   Prior Therapy  none      Palpation   SI assessment   Rt ASIS lower than Lt; Rt PSIS higher than the Lt; Rt sacral torsion; iliac crest level in sitting and standing    Palpation comment  tenderness noted at Rt PSIS       [facility] Adult PT Treatment/Exercise - 06/24/19 0001      Lumbar Exercises: Stretches   Passive Hamstring Stretch  Right;2 reps;30 seconds   supine with strap   Passive Hamstring Stretch Limitations  cues to keep knee straight    ITB Stretch  Right;2 reps;30 seconds    supine with strap   Piriformis Stretch  2 reps;30 seconds   seated; knee to opposite shoulder     Lumbar Exercises: Aerobic   Nustep  L6 x 3 min      Lumbar Exercises: Supine   Ab Set  5 reps;5 seconds    Straight Leg Raise  5 reps    Straight Leg Raises Limitations  limited tolerance due to Rt sacral pain    Other Supine Lumbar Exercises  transverse abd series: clams x 5 reps Rt/Lt; Rt/Ltt heel slides x 10 reps (limited tolerance due to sacral pain)   cues for pelvic stabilization      Knee/Hip Exercises: Stretches   Passive Hamstring Stretch  Right;2 reps;30 seconds   in supine with strap   ITB Stretch  Right;2 reps;30 seconds   in supine with strap    Piriformis Stretch  Right;2 reps;30 seconds    Piriformis Stretch Limitations  sitting       Knee/Hip Exercises: Supine   Other Supine Knee/Hip Exercises  3 reps of self-MET correction: 90/90 hip flex with dowel above Lt thigh, under Rt thigh (co-contraction x  5 sec)      Manual Therapy   Manual Therapy  Muscle Energy Technique    Muscle Energy Technique  MET to correct ant rotated innominate in supine with contract relax of Rt hamstring/ passive hip flexion to chest x 3 reps, 2 sets; followed by bridge with adductor squeeze to resent pelvis.                   PT Long Term Goals - 06/09/19 1830      PT LONG TERM GOAL #1   Title  The patient will be independent with HEP.    Time  6    Period  Weeks    Target Date  07/24/19      PT LONG TERM GOAL #2   Title  The patient will maintain functinal limitation at < or equal to 17% limitation (per FOTO).    Time  6    Period  Weeks    Target Date  07/24/19      PT LONG TERM GOAL #3   Title  The patient will report centralizing of symptoms from R LE to low back to reduce radicular symptoms.    Time  6    Period  Weeks    Target Date  07/24/19      PT LONG TERM GOAL #4   Title  The patient will  improve R hip flexion to 5/5 without pain for improved functional  strength.    Time  6    Period  Weeks    Target Date  07/25/19            Plan - 06/24/19 1014    Clinical Impression Statement  Pt presents with pelvic asymmetries (Rt ant rotated innominate, Rt sacral torsion); improved alignment after MET corrections. Limited tolerance to SLR and transversus abdominal Rt heel slide due to discomfort in Rt sacrum; improved tolerance after placing pillow under hips. Pt continues to make progress toward LTG #3; will continue to benefit from skilled PT for improved functional mobility.    Examination-Activity Limitations  Lift;Locomotion Level    Stability/Clinical Decision Making  Stable/Uncomplicated    Rehab Potential  Good    PT Frequency  2x / week    PT Duration  6 weeks    PT Treatment/Interventions  ADLs/Self Care Home Management;Gait training;Stair training;Functional mobility training;Therapeutic activities;Therapeutic exercise;Electrical Stimulation;Cryotherapy;Iontophoresis 38m/ml Dexamethasone;Moist Heat;Traction;Ultrasound;Neuromuscular re-education;Manual techniques;Patient/family education;Orthotic Fit/Training;Dry needling;Passive range of motion;Joint Manipulations;Spinal Manipulations;Taping    PT Next Visit Plan  Assess pelvis alignment and continue MET corrections. Continue to progress core strengthening at neutral, R hip strengthening, and stretches.    PT Home Exercise Plan  Access Code: [email or web address]   Consulted and Agree with Plan of Care  Patient       Patient will benefit from skilled therapeutic intervention in order to improve the following deficits and impairments:  Pain, Postural dysfunction, Decreased strength, Impaired flexibility  Visit Diagnosis: Acute right-sided low back pain with sciatica, sciatica laterality unspecified  Muscle weakness (generalized)  Abnormal posture     Problem List Patient Active Problem List   Diagnosis Date Noted  . Right lumbar radiculitis 06/03/2019  . Primary osteoarthritis of right  knee 06/16/2017  . Prepatellar bursitis, right knee 05/15/2017  . Ganglion cyst of right foot 05/15/2017  . Plantar fasciitis, right 05/15/2017    KGardiner Rhyme SPTA 06/24/2019, 12:34 PM   JKerin Perna PTA 06/24/19 1:29 PM   [id number] [address]  [place], [place], [address] Phone: [phone]   Fax:  [phone]  Name: Mariah Montgomery MRN: [id number] Date of Birth: 06/01/1946

## 2019-06-28 ENCOUNTER — Other Ambulatory Visit: Payer: Self-pay

## 2019-06-28 ENCOUNTER — Encounter: Payer: Self-pay | Admitting: Rehabilitative and Restorative Service Providers"

## 2019-06-28 ENCOUNTER — Ambulatory Visit (INDEPENDENT_AMBULATORY_CARE_PROVIDER_SITE_OTHER): Payer: Medicare Other | Admitting: Rehabilitative and Restorative Service Providers"

## 2019-06-28 DIAGNOSIS — M5440 Lumbago with sciatica, unspecified side: Secondary | ICD-10-CM

## 2019-06-28 DIAGNOSIS — R293 Abnormal posture: Secondary | ICD-10-CM | POA: Diagnosis not present

## 2019-06-28 DIAGNOSIS — M6281 Muscle weakness (generalized): Secondary | ICD-10-CM | POA: Diagnosis not present

## 2019-06-28 NOTE — Patient Instructions (Signed)
Access Code: [email or web address]  URL: [email or web address]  Date: 06/28/2019  Prepared by: Rudell Cobb   Exercises Seated Piriformis Stretch with Trunk Bend - 3 reps - 1 sets - 30 seconds hold - 2x daily - 7x weekly Supine ITB Stretch with Strap - 3 reps - 1 sets - 30 secpmds hold - 2x daily - 7x weekly Marching Bridge - 10 reps - 3 sets - 1x daily - 7x weekly Clamshell with Resistance - 10 reps - 3 sets - 1x daily - 7x weekly Beginner Front Arm Support - 10 reps - 1 sets - 3 seconds hold - 2x daily - 7x weekly Prone Transversus Abdominus Contraction with Small Hip Extension - 10 reps - 1 sets - 2x daily - 7x weekly

## 2019-06-28 NOTE — Therapy (Signed)
[facility] [place], [place], [address] Phone: [phone]   Fax:  [phone]  Physical Therapy Treatment  Patient Details  Name: Mariah Montgomery MRN: [id number] Date of Birth: 1946/01/10 Referring Provider (PT): Silverio Decamp, MD   Encounter Date: 06/28/2019  PT End of Session - 06/28/19 0928    Visit Number  4    Number of Visits  12    Date for PT Re-Evaluation  07/24/19    PT Start Time  0925    PT Stop Time  1010    PT Time Calculation (min)  45 min    Activity Tolerance  Patient tolerated treatment well    Behavior During Therapy  [facility] for tasks assessed/performed       History reviewed. No pertinent past medical history.  History reviewed. No pertinent surgical history.  There were no vitals filed for this visit.  Subjective Assessment - 06/28/19 0924    Subjective  The patient reports her thigh continues with pain and she also has pain in R SI region.  Thigh only hurts when she touches the leg.    Pertinent History  reviewed in epic    Patient Stated Goals  reduce discomfort    Currently in Pain?  Yes    Pain Score  --   as long as back is not hurting, patient does not feel pain   Pain Location  Leg    Pain Descriptors / Indicators  Sore    Pain Radiating Towards  R thigh    Pain Onset  More than a month ago    Pain Frequency  Constant    Aggravating Factors   R leg sensitive to touch    Pain Relieving Factors  nothing reduces sensation         [facility] PT Assessment - 06/28/19 0930      Posture/Postural Control   Posture Comments  leg length supine after bridge= level; in long sitting, R leg is longer by 0.5",   bilateral leg length is 81 cm                   [facility] Adult PT Treatment/Exercise - 06/28/19 0930      Ambulation/Gait   Ambulation/Gait  Yes    Ambulation/Gait Assistance  7: Independent    Gait Pattern  Trendelenburg      Self-Care   Self-Care   Other Self-Care Comments    Other Self-Care Comments   discussed using roller for R IT band tightness      Exercises   Exercises  Lumbar;Knee/Hip      Lumbar Exercises: Stretches   Quad Stretch  Right;Left;2 reps;30 seconds    ITB Stretch  Right;2 reps;30 seconds    Piriformis Stretch  Right;Left;1 rep;30 seconds      Lumbar Exercises: Supine   Bridge  10 reps;3 seconds    Bridge with Ball Squeeze  Non-compliant;10 reps;3 seconds    Bridge with March  10 reps;Non-compliant    Other Supine Lumbar Exercises  supine R resisted hip flexion (self resisted) x 3 reps and L x 3 reps.  *Patiient has pain on R side with performance.      Lumbar Exercises: Sidelying   Clam  Right;10 reps    Clam Limitations  green t-band    Hip Abduction  Right;10 reps      Lumbar Exercises: Prone   Straight Leg Raise  10 reps  Manual Therapy   Manual Therapy  Muscle Energy Technique;Soft tissue mobilization;Joint mobilization    Manual therapy comments  muscle energy in supine, STM in left sidelying    Joint Mobilization  grade II R SI P>A mobilization with R foot in IR and L foot in ER     Soft tissue mobilization  STM to R IT Band with IASTM    Myofascial Release  MFR to distal R lateral hamstring    Muscle Energy Technique  muscle energy eliciting R hip flexor contraction while engaging L hamstrings for SI muscle energy correction; during bridge performed resisted hip adduction and hip abduction.             PT Education - 06/28/19 1348    Education Details  updated HEP    Person(s) Educated  Patient    Methods  Explanation;Demonstration;Handout    Comprehension  Returned demonstration;Verbalized understanding          PT Long Term Goals - 06/09/19 1830      PT LONG TERM GOAL #1   Title  The patient will be independent with HEP.    Time  6    Period  Weeks    Target Date  07/24/19      PT LONG TERM GOAL #2   Title  The patient will maintain functinal limitation at < or equal  to 17% limitation (per FOTO).    Time  6    Period  Weeks    Target Date  07/24/19      PT LONG TERM GOAL #3   Title  The patient will report centralizing of symptoms from R LE to low back to reduce radicular symptoms.    Time  6    Period  Weeks    Target Date  07/24/19      PT LONG TERM GOAL #4   Title  The patient will  improve R hip flexion to 5/5 without pain for improved functional strength.    Time  6    Period  Weeks    Target Date  07/25/19            Plan - 06/28/19 1349    Clinical Impression Statement  The patient is progressing with therapeutic exercise adding bridges and bridges with  marching.  Self resisted hip flexion in supine provokes R sided SI pain.  PT to continue working on stretching, stabilization and strengthening to progress to LTGS.    PT Treatment/Interventions  ADLs/Self Care Home Management;Gait training;Stair training;Functional mobility training;Therapeutic activities;Therapeutic exercise;Electrical Stimulation;Cryotherapy;Iontophoresis 62m/ml Dexamethasone;Moist Heat;Traction;Ultrasound;Neuromuscular re-education;Manual techniques;Patient/family education;Orthotic Fit/Training;Dry needling;Passive range of motion;Joint Manipulations;Spinal Manipulations;Taping    PT Next Visit Plan  Continue MET corrections. Continue to progress core strengthening at neutral, R hip strengthening, and stretches.    PT Home Exercise Plan  Access Code: [email or web address]   Consulted and Agree with Plan of Care  Patient       Patient will benefit from skilled therapeutic intervention in order to improve the following deficits and impairments:  Pain, Postural dysfunction, Decreased strength, Impaired flexibility  Visit Diagnosis: Acute right-sided low back pain with sciatica, sciatica laterality unspecified  Muscle weakness (generalized)  Abnormal posture     Problem List Patient Active Problem List   Diagnosis Date Noted  . Right lumbar radiculitis 06/03/2019  .  Primary osteoarthritis of right knee 06/16/2017  . Prepatellar bursitis, right knee 05/15/2017  . Ganglion cyst of right foot 05/15/2017  . Plantar fasciitis, right 05/15/2017  Mariah Montgomery, Mariah Montgomery 06/28/2019, 1:55 PM  [facility] [place], [place], [address] Phone: [phone]   Fax:  [phone]  Name: Mariah JAFFE MRN: [id number] Date of Birth: 1945/10/26

## 2019-07-01 ENCOUNTER — Other Ambulatory Visit: Payer: Self-pay

## 2019-07-01 ENCOUNTER — Encounter: Payer: Self-pay | Admitting: Rehabilitative and Restorative Service Providers"

## 2019-07-01 ENCOUNTER — Ambulatory Visit (INDEPENDENT_AMBULATORY_CARE_PROVIDER_SITE_OTHER): Payer: Medicare Other | Admitting: Rehabilitative and Restorative Service Providers"

## 2019-07-01 DIAGNOSIS — R293 Abnormal posture: Secondary | ICD-10-CM | POA: Diagnosis not present

## 2019-07-01 DIAGNOSIS — M6281 Muscle weakness (generalized): Secondary | ICD-10-CM | POA: Diagnosis not present

## 2019-07-01 DIAGNOSIS — M5440 Lumbago with sciatica, unspecified side: Secondary | ICD-10-CM | POA: Diagnosis not present

## 2019-07-01 NOTE — Therapy (Signed)
[facility] [place] [address] [place], [place], [address] Phone: [phone]   Fax:  [phone]  Physical Therapy Treatment  Patient Details  Name: Mariah Montgomery MRN: [id number] Date of Birth: [birth year] Referring Provider (PT): Monica Becton, MD   Encounter Date: 07/01/2019  PT End of Session - 07/01/19 1544    Visit Number  5    Number of Visits  12    Date for PT Re-Evaluation  07/24/19    PT Start Time  1020    PT Stop Time  1100    PT Time Calculation (min)  40 min    Activity Tolerance  Patient tolerated treatment well    Behavior During Therapy  [facility] for tasks assessed/performed       History reviewed. No pertinent past medical history.  History reviewed. No pertinent surgical history.  There were no vitals filed for this visit.  Subjective Assessment - 07/01/19 1025    Subjective  Able to use a pastry roller at home for IT band STM.    Pertinent History  reviewed in epic    Patient Stated Goals  reduce discomfort    Currently in Pain?  Yes    Pain Score  0-No pain   "almost none"   Pain Location  Leg    Pain Onset  More than a month ago    Pain Frequency  Constant                       [facility] Adult PT Treatment/Exercise - 07/01/19 1033      Exercises   Exercises  Lumbar;Knee/Hip      Lumbar Exercises: Stretches   Hip Flexor Stretch  1 rep;30 seconds;Left    Hip Flexor Stretch Limitations  with tissue rolling along quadriceps    ITB Stretch  Right;1 rep;30 seconds    Other Lumbar Stretch Exercise  quadratus lumborum stretch sidelying wiht IT Band x 30 seconds x 2 reps      Lumbar Exercises: Supine   Straight Leg Raise  10 reps    Straight Leg Raises Limitations  Patient continues with point specific pain R PSIS with R SLR      Lumbar Exercises: Prone   Straight Leg Raise  10 reps    Straight Leg Raises Limitations  performed with knees flexed R and L sides    Other  Prone Lumbar Exercises  with extended leg SLR prone, the patient has R PSIS pain when attempting to lift L side.      Lumbar Exercises: Quadruped   Opposite Arm/Leg Raise  10 reps;Right arm/Left leg;Left arm/Right leg    Opposite Arm/Leg Raise Limitations  slow and controlled movement without pain noted    Other Quadruped Lumbar Exercises  quadriped to child's pose with lateral leaning for trunk elongation/stretch.      Manual Therapy   Manual Therapy  Soft tissue mobilization    Manual therapy comments  IASTM and soft tissue mobilization R anterior, lateral and posterolateral thigh    Soft tissue mobilization  R IT band, R quad, R hamstrings                  PT Long Term Goals - 06/09/19 1830      PT LONG TERM GOAL #1   Title  The patient will be independent with HEP.    Time  6    Period  Weeks    Target Date  07/24/19      PT LONG TERM GOAL #2   Title  The patient will maintain functinal limitation at < or equal to 17% limitation (per FOTO).    Time  6    Period  Weeks    Target Date  07/24/19      PT LONG TERM GOAL #3   Title  The patient will report centralizing of symptoms from R LE to low back to reduce radicular symptoms.    Time  6    Period  Weeks    Target Date  07/24/19      PT LONG TERM GOAL #4   Title  The patient will  improve R hip flexion to 5/5 without pain for improved functional strength.    Time  6    Period  Weeks    Target Date  07/25/19            Plan - 07/01/19 1047    Clinical Impression Statement  The patient does not have back pain with walking for exercise, however PSIS R sided pain is reproduced with R SLR supine and L SLR prone.  The patient has point tender pain R distal IT band, R quad/IT band and R hamstring (lateral head).  PT progressing ther ex for stabilization focusing on soft tissue moblity of R thigh.    PT Treatment/Interventions  ADLs/Self Care Home Management;Gait training;Stair training;Functional mobility  training;Therapeutic activities;Therapeutic exercise;Electrical Stimulation;Cryotherapy;Iontophoresis 4mg /ml Dexamethasone;Moist Heat;Traction;Ultrasound;Neuromuscular re-education;Manual techniques;Patient/family education;Orthotic Fit/Training;Dry needling;Passive range of motion;Joint Manipulations;Spinal Manipulations;Taping    PT Next Visit Plan  focus on hip flexion strength, hip extension strength dec'ing pain at R PSIS.  * Can Almyra Free switch to do dry needling next week?  R quad, R IT and R LH hamstring    PT Home Exercise Plan  Access Code: [email or web address]    Consulted and Agree with Plan of Care  Patient       Patient will benefit from skilled therapeutic intervention in order to improve the following deficits and impairments:  Pain, Postural dysfunction, Decreased strength, Impaired flexibility  Visit Diagnosis: Acute right-sided low back pain with sciatica, sciatica laterality unspecified  Muscle weakness (generalized)  Abnormal posture     Problem List Patient Active Problem List   Diagnosis Date Noted  . Right lumbar radiculitis 06/03/2019  . Primary osteoarthritis of right knee 06/16/2017  . Prepatellar bursitis, right knee 05/15/2017  . Ganglion cyst of right foot 05/15/2017  . Plantar fasciitis, right 05/15/2017    Rease Swinson, PT 07/01/2019, 3:47 PM  [facility] [place], [place], [address] Phone: [phone]   Fax:  [phone]  Name: Mariah Montgomery MRN: [id number] Date of Birth: 1945-07-22

## 2019-07-05 ENCOUNTER — Encounter (INDEPENDENT_AMBULATORY_CARE_PROVIDER_SITE_OTHER): Payer: Self-pay

## 2019-07-05 ENCOUNTER — Other Ambulatory Visit: Payer: Self-pay

## 2019-07-05 ENCOUNTER — Ambulatory Visit (INDEPENDENT_AMBULATORY_CARE_PROVIDER_SITE_OTHER): Payer: Medicare Other | Admitting: Physical Therapy

## 2019-07-05 ENCOUNTER — Encounter: Payer: Self-pay | Admitting: Physical Therapy

## 2019-07-05 DIAGNOSIS — R293 Abnormal posture: Secondary | ICD-10-CM

## 2019-07-05 DIAGNOSIS — M6281 Muscle weakness (generalized): Secondary | ICD-10-CM | POA: Diagnosis not present

## 2019-07-05 DIAGNOSIS — M5440 Lumbago with sciatica, unspecified side: Secondary | ICD-10-CM | POA: Diagnosis not present

## 2019-07-05 NOTE — Patient Instructions (Signed)
Trigger Point Dry Needling  . What is Trigger Point Dry Needling (DN)? o DN is a physical therapy technique used to treat muscle pain and dysfunction. Specifically, DN helps deactivate muscle trigger points (muscle knots).  o A thin filiform needle is used to penetrate the skin and stimulate the underlying trigger point. The goal is for a local twitch response (LTR) to occur and for the trigger point to relax. No medication of any kind is injected during the procedure.   . What Does Trigger Point Dry Needling Feel Like?  o The procedure feels different for each individual patient. Some patients report that they do not actually feel the needle enter the skin and overall the process is not painful. Very mild bleeding may occur. However, many patients feel a deep cramping in the muscle in which the needle was inserted. This is the local twitch response.   Marland Kitchen How Will I feel after the treatment? o Soreness is normal, and the onset of soreness may not occur for a few hours. Typically this soreness does not last longer than two days.  o Bruising is uncommon, however; ice can be used to decrease any possible bruising.  o In rare cases feeling tired or nauseous after the treatment is normal. In addition, your symptoms may get worse before they get better, this period will typically not last longer than 24 hours.   . What Can I do After My Treatment? o Increase your hydration by drinking more water for the next 24 hours. o You may place ice or heat on the areas treated that have become sore, however, do not use heat on inflamed or bruised areas. Heat often brings more relief post needling. o You can continue your regular activities, but vigorous activity is not recommended initially after the treatment for 24 hours. o DN is best combined with other physical therapy such as strengthening, stretching, and other therapies.   Madelyn Flavors, PT 07/05/19 10:14 AM  [facility] [place], [place] [address]  [phone] (office) [phone] (fax)

## 2019-07-05 NOTE — Therapy (Signed)
[place], [place], [address] Phone: [phone]   Fax:  [phone]  Physical Therapy Treatment  Patient Details  Name: Mariah Montgomery MRN: [id number] Date of Birth: 06/23/1946 Referring Provider (PT): Silverio Decamp, MD   Encounter Date: 07/05/2019  PT End of Session - 07/05/19 0849    Visit Number  6    Number of Visits  12    Date for PT Re-Evaluation  07/24/19    PT Start Time  0933    PT Stop Time  1015    PT Time Calculation (min)  42 min    Activity Tolerance  Patient tolerated treatment well    Behavior During Therapy  [facility] for tasks assessed/performed       History reviewed. No pertinent past medical history.  History reviewed. No pertinent surgical history.  There were no vitals filed for this visit.  Subjective Assessment - 07/05/19 0934    Subjective  The roller is helping the most. Patient feels back when transfering into the car or getting into the bed at night.    Patient Stated Goals  reduce discomfort    Currently in Pain?  Yes    Pain Score  3     Pain Location  Leg    Pain Orientation  Right    Pain Descriptors / Indicators  Sore         [facility] PT Assessment - 07/05/19 0001      Strength   Right Hip Flexion  4-/5   with poor stabilization                  [facility] Adult PT Treatment/Exercise - 07/05/19 0001      Knee/Hip Exercises: Stretches   Sports administrator  Right;1 rep;60 seconds    Quad Stretch Limitations  SDLY with left foot on top of knee    ITB Stretch  Right;1 rep;60 seconds    ITB Stretch Limitations  with strap    Piriformis Stretch  Right;1 rep;30 seconds    Piriformis Stretch Limitations  supine fig 4      Manual Therapy   Manual Therapy  Soft tissue mobilization;Myofascial release    Manual therapy comments  skilled palpation and monitoring of soft tissue during DN     Soft tissue mobilization  to right quads, gluteals and  Pirformis    Myofascial Release  to right ITB in [facility]       Trigger Point Dry Needling - 07/05/19 0001    Consent Given?  Yes    Education Handout Provided  Yes    Muscles Treated Lower Quadrant  Vastus lateralis;Rectus femoris;Vastus intermedius    Muscles Treated Back/Hip  Gluteus minimus;Gluteus maximus;Piriformis;Tensor fascia lata    Dry Needling Comments  right    Rectus femoris Response  Twitch response elicited;Palpable increased muscle length    Vastus lateralis Response  Twitch response elicited;Palpable increased muscle length    Vastus intermedius Response  Twitch response elicited;Palpable increased muscle length    Gluteus Minimus Response  Twitch response elicited;Palpable increased muscle length    Gluteus Maximus Response  Twitch response elicited;Palpable increased muscle length    Piriformis Response  Twitch response elicited;Palpable increased muscle length    Tensor Fascia Lata Response  Twitch response elicited;Palpable increased muscle length                PT Long Term Goals - 07/05/19 0935      PT  LONG TERM GOAL #1   Title  The patient will be independent with HEP.    Status  Partially Met      PT LONG TERM GOAL #3   Title  The patient will report centralizing of symptoms from R LE to low back to reduce radicular symptoms.    Status  On-going      PT LONG TERM GOAL #4   Title  The patient will  improve R hip flexion to 5/5 without pain for improved functional strength.    Status  On-going            Plan - 07/05/19 1029    Clinical Impression Statement  Patient did very well with DN to RLE and hip today. She had ++ localized twitch responses throughout. Goals are ongoing. She has poor stabilization with MMT of right hip flexor and will benefit from continued stabilization.    PT Frequency  2x / week    PT Duration  6 weeks    PT Treatment/Interventions  ADLs/Self Care Home Management;Gait training;Stair training;Functional mobility  training;Therapeutic activities;Therapeutic exercise;Electrical Stimulation;Cryotherapy;Iontophoresis 9m/ml Dexamethasone;Moist Heat;Traction;Ultrasound;Neuromuscular re-education;Manual techniques;Patient/family education;Orthotic Fit/Training;Dry needling;Passive range of motion;Joint Manipulations;Spinal Manipulations;Taping    PT Next Visit Plan  Assess DN. focus on hip flexion strength, hip extension strength dec'ing pain at R PSIS.    PT Home Exercise Plan  Access Code: [email or web address]   Consulted and Agree with Plan of Care  Patient       Patient will benefit from skilled therapeutic intervention in order to improve the following deficits and impairments:  Pain, Postural dysfunction, Decreased strength, Impaired flexibility  Visit Diagnosis: Acute right-sided low back pain with sciatica, sciatica laterality unspecified  Muscle weakness (generalized)  Abnormal posture     Problem List Patient Active Problem List   Diagnosis Date Noted  . Right lumbar radiculitis 06/03/2019  . Primary osteoarthritis of right knee 06/16/2017  . Prepatellar bursitis, right knee 05/15/2017  . Ganglion cyst of right foot 05/15/2017  . Plantar fasciitis, right 05/15/2017   JMadelyn FlavorsPT 07/05/2019, 10:34 AM  [facility] [address]Phone: [phone]  Fax:  [phone] Name: Mariah PEBLEYMRN: [id number] of Birth: 814-May-1947

## 2019-07-08 ENCOUNTER — Encounter: Payer: Self-pay | Admitting: Physical Therapy

## 2019-07-08 ENCOUNTER — Ambulatory Visit (INDEPENDENT_AMBULATORY_CARE_PROVIDER_SITE_OTHER): Payer: Medicare Other | Admitting: Physical Therapy

## 2019-07-08 ENCOUNTER — Other Ambulatory Visit: Payer: Self-pay

## 2019-07-08 DIAGNOSIS — M5440 Lumbago with sciatica, unspecified side: Secondary | ICD-10-CM | POA: Diagnosis not present

## 2019-07-08 DIAGNOSIS — M6281 Muscle weakness (generalized): Secondary | ICD-10-CM | POA: Diagnosis not present

## 2019-07-08 DIAGNOSIS — R293 Abnormal posture: Secondary | ICD-10-CM | POA: Diagnosis not present

## 2019-07-08 NOTE — Therapy (Signed)
[place], [place], [address] Phone: [phone]   Fax:  [phone]  Physical Therapy Treatment  Patient Details  Name: Mariah Montgomery MRN: [id number] Date of Birth: Nov 30, 1945 Referring Provider (PT): Silverio Decamp, MD   Encounter Date: 07/08/2019  PT End of Session - 07/08/19 0932    Visit Number  7    Number of Visits  12    Date for PT Re-Evaluation  07/24/19    PT Start Time  0932    PT Stop Time  6378    PT Time Calculation (min)  42 min    Activity Tolerance  Patient tolerated treatment well;No increased pain    Behavior During Therapy  [facility] for tasks assessed/performed       History reviewed. No pertinent past medical history.  History reviewed. No pertinent surgical history.  There were no vitals filed for this visit.  Subjective Assessment - 07/08/19 0936    Subjective  Pt reports 75% improvement since starting therapy.  The DN was helpful; leg is not as tight.  However she still has "tingling" in Rt ITB area.  And continued pain (in PSIS area) when getting into car.    Currently in Pain?  Yes    Pain Score  4     Pain Location  Back; Rt, lower        [facility] PT Assessment - 07/08/19 0001      Assessment   Medical Diagnosis  M54.16 (ICD-10-CM) - Right lumbar radiculitis    Referring Provider (PT)  Silverio Decamp, MD    Onset Date/Surgical Date  --   8 weeks ago   Prior Therapy  none      Observation/Other Assessments   Focus on Therapeutic Outcomes (FOTO)   94% (6% limitation)        [facility] Adult PT Treatment/Exercise - 07/08/19 0001      Lumbar Exercises: Prone   Opposite Arm/Leg Raise  Right arm/Left leg;Left arm/Right leg;10 reps;2 seconds    Other Prone Lumbar Exercises  prone press x 5 sec x 5 reps, then prone press with unilateral knee bends to 90 deg x 8 reps each leg, and trial of bilat knee bend x 3 reps (increased back pain; stopped)       Knee/Hip Exercises: Stretches   Personal assistant reps;60 seconds    Piriformis Stretch  Left;Right;1 rep;30 seconds    Piriformis Stretch Limitations  supine fig 4      Modalities   Modalities  Electrical Stimulation;Ultrasound;Iontophoresis      Acupuncturist Location  Rt glute/Low back near Corporate investment banker  combo [place]     Electrical Stimulation Parameters  intensity to tolerance     Electrical Stimulation Goals  Pain      Ultrasound   Ultrasound Location  see estim    Ultrasound Parameters  100%, 1.2 w/cm2 x 8 min    Ultrasound Goals  Pain      Iontophoresis   Type of Iontophoresis  Dexamethasone    Location  Rt PSIS    Dose  1.0 cc     Time  80 mA stat patch (6 hr wear time)      Manual Therapy   Soft tissue mobilization  to Rt glute and hip rotators with/without passive IR/ER of LE.  PT Education - 07/08/19 1354    Education Details  ionto info    Person(s) Educated  Patient    Methods  Explanation;Handout    Comprehension  Verbalized understanding          PT Long Term Goals - 07/08/19 1057      PT LONG TERM GOAL #1   Title  The patient will be independent with HEP.    Time  6    Period  Weeks    Status  On-going      PT LONG TERM GOAL #2   Title  The patient will maintain functinal limitation at < or equal to 17% limitation (per FOTO).    Time  6    Period  Weeks    Status  Achieved      PT LONG TERM GOAL #3   Title  The patient will report centralizing of symptoms from R LE to low back to reduce radicular symptoms.    Time  6    Period  Weeks    Status  Partially Met      PT LONG TERM GOAL #4   Title  The patient will  improve R hip flexion to 5/5 without pain for improved functional strength.    Time  6    Period  Weeks    Status  Partially Met            Plan - 07/08/19 1400    Clinical Impression Statement  Pt had positive response to DN last session;  less tightness and symptoms in leg.  She had some pain with pelvic press with bilat knee bends; did not add pelvic press series to HEP.  Pt continues with persistant localized pain in Rt PSIS; trial of ionto patch to this area.  She has partially met her goals and requests to hold therapy until upcoming MD appt.    PT Frequency  2x / week    PT Duration  6 weeks    PT Treatment/Interventions  ADLs/Self Care Home Management;Gait training;Stair training;Functional mobility training;Therapeutic activities;Therapeutic exercise;Electrical Stimulation;Cryotherapy;Iontophoresis 23m/ml Dexamethasone;Moist Heat;Traction;Ultrasound;Neuromuscular re-education;Manual techniques;Patient/family education;Orthotic Fit/Training;Dry needling;Passive range of motion;Joint Manipulations;Spinal Manipulations;Taping    PT Next Visit Plan  continue pelvis/core stabilization; assess response to ionto.    PT Home Exercise Plan  Access Code: [email or web address]   Consulted and Agree with Plan of Care  Patient       Patient will benefit from skilled therapeutic intervention in order to improve the following deficits and impairments:  Pain, Postural dysfunction, Decreased strength, Impaired flexibility  Visit Diagnosis: Acute right-sided low back pain with sciatica, sciatica laterality unspecified  Muscle weakness (generalized)  Abnormal posture     Problem List Patient Active Problem List   Diagnosis Date Noted  . Right lumbar radiculitis 06/03/2019  . Primary osteoarthritis of right knee 06/16/2017  . Prepatellar bursitis, right knee 05/15/2017  . Ganglion cyst of right foot 05/15/2017  . Plantar fasciitis, right 05/15/2017   JKerin Perna PTA 07/08/19 2:03 PM   [email or web address] [address]Phone: [phone]  Fax:  [phone] Name: Mariah HEMMELGARNMRN: [id number] of Birth: [birth year]

## 2019-07-08 NOTE — Patient Instructions (Signed)

## 2019-07-11 ENCOUNTER — Other Ambulatory Visit: Payer: Self-pay

## 2019-07-11 ENCOUNTER — Encounter: Payer: Self-pay | Admitting: Sports Medicine

## 2019-07-11 ENCOUNTER — Ambulatory Visit (INDEPENDENT_AMBULATORY_CARE_PROVIDER_SITE_OTHER): Payer: Medicare Other | Admitting: Sports Medicine

## 2019-07-11 DIAGNOSIS — M5416 Radiculopathy, lumbar region: Secondary | ICD-10-CM | POA: Diagnosis not present

## 2019-07-11 MED ORDER — ACETAMINOPHEN ER 650 MG PO TBCR: 650.0000 mg | EXTENDED_RELEASE_TABLET | Freq: Three times a day (TID) | ORAL | 3 refills | Status: AC | PRN

## 2019-07-11 NOTE — Progress Notes (Signed)
Subjective:    CC: Follow-up  HPI: This is a pleasant 73 year old female, she returns, we are treating her for right L4 distribution radiculitis with prednisone, physical therapy, dry needling has been tremendously efficacious.  She is about 60 to 70% better.  At this point she feels though she can live with her current level of discomfort.  I reviewed the past medical history, family history, social history, surgical history, and allergies today and no changes were needed.  Please see the problem list section below in epic for further details.  Past Medical History: No past medical history on file. Past Surgical History: No past surgical history on file. Social History: Social History   Socioeconomic History  . Marital status: Married    Spouse name: Not on file  . Number of children: Not on file  . Years of education: Not on file  . Highest education level: Not on file  Occupational History  . Not on file  Tobacco Use  . Smoking status: Never Smoker  . Smokeless tobacco: Never Used  Substance and Sexual Activity  . Alcohol use: Not on file  . Drug use: Not on file  . Sexual activity: Not on file  Other Topics Concern  . Not on file  Social History Narrative  . Not on file   Social Determinants of Health   Financial Resource Strain:   . Difficulty of Paying Living Expenses: Not on file  Food Insecurity:   . Worried About Charity fundraiser in the Last Year: Not on file  . Ran Out of Food in the Last Year: Not on file  Transportation Needs:   . Lack of Transportation (Medical): Not on file  . Lack of Transportation (Non-Medical): Not on file  Physical Activity:   . Days of Exercise per Week: Not on file  . Minutes of Exercise per Session: Not on file  Stress:   . Feeling of Stress : Not on file  Social Connections:   . Frequency of Communication with Friends and Family: Not on file  . Frequency of Social Gatherings with Friends and Family: Not on file  .  Attends Religious Services: Not on file  . Active Member of Clubs or Organizations: Not on file  . Attends Archivist Meetings: Not on file  . Marital Status: Not on file   Family History: No family history on file. Allergies: Allergies  Allergen Reactions  . Meloxicam Rash   Medications: See med rec.  Review of Systems: No fevers, chills, night sweats, weight loss, chest pain, or shortness of breath.   Objective:    General: Well Developed, well nourished, and in no acute distress.  Neuro: Alert and oriented x3, extra-ocular muscles intact, sensation grossly intact.  HEENT: Normocephalic, atraumatic, pupils equal round reactive to light, neck supple, no masses, no lymphadenopathy, thyroid nonpalpable.  Skin: Warm and dry, no rashes. Cardiac: Regular rate and rhythm, no murmurs rubs or gallops, no lower extremity edema.  Respiratory: Clear to auscultation bilaterally. Not using accessory muscles, speaking in full sentences.  Impression and Recommendations:    Right lumbar radiculitis Right L4 radiculitis, 60 to 70% better with physical therapy, prednisone. Declines meloxicam. She will do arthritis from Tylenol as needed, continue her exercises at home and she can return to see me as needed, she feels as though she can live with how things are going right now.   ___________________________________________ Gwen Her. Dianah Field, M.D., ABFM., CAQSM. Primary Care and [place]  [facility]  Adjunct Professor of [place] [employer] [facility] of Medicine

## 2019-07-11 NOTE — Assessment & Plan Note (Signed)
Right L4 radiculitis, 60 to 70% better with physical therapy, prednisone. Declines meloxicam. She will do arthritis from Tylenol as needed, continue her exercises at home and she can return to see me as needed, she feels as though she can live with how things are going right now.

## 2019-08-10 ENCOUNTER — Encounter: Payer: Self-pay | Admitting: Rehabilitative and Restorative Service Providers"

## 2019-08-10 NOTE — Therapy (Signed)
[place], [place], [address] Phone: [phone]   Fax:  [phone]  Patient Details  Name: Mariah Montgomery MRN: [id number] Date of Birth: 1946-06-21 Referring Provider:  Aundria Mems, MD  Last encounter 07/08/19 PHYSICAL THERAPY DISCHARGE SUMMARY  Visits from Start of Care: 7  Current functional level related to goals / functional outcomes: PT Long Term Goals - 07/08/19 1057      PT LONG TERM GOAL #1   Title  The patient will be independent with HEP.    Time  6    Period  Weeks    Status  On-going      PT LONG TERM GOAL #2   Title  The patient will maintain functinal limitation at < or equal to 17% limitation (per FOTO).    Time  6    Period  Weeks    Status  Achieved      PT LONG TERM GOAL #3   Title  The patient will report centralizing of symptoms from R LE to low back to reduce radicular symptoms.    Time  6    Period  Weeks    Status  Partially Met      PT LONG TERM GOAL #4   Title  The patient will  improve R hip flexion to 5/5 without pain for improved functional strength.    Time  6    Period  Weeks    Status  Partially Met         Remaining deficits: Patient notes some deficits with strength and occasional pain. She has HEP to address ongoing limitations.   Education / Equipment: HEP, return to activity  Plan: Patient agrees to discharge.  Patient goals were partially met. Patient is being discharged due to meeting the stated rehab goals.  ?????    Thank you for the referral of this patient. Mariah Montgomery, MPT   Encounter Date: 08/10/2019   Mariah Montgomery 08/10/2019, 12:48 PM  [facility] [place], [place], [address] Phone: [phone]   Fax:  [phone]

## 2020-12-25 ENCOUNTER — Ambulatory Visit (INDEPENDENT_AMBULATORY_CARE_PROVIDER_SITE_OTHER): Payer: Medicare Other

## 2020-12-25 ENCOUNTER — Ambulatory Visit (INDEPENDENT_AMBULATORY_CARE_PROVIDER_SITE_OTHER): Payer: Medicare Other | Admitting: Sports Medicine

## 2020-12-25 ENCOUNTER — Other Ambulatory Visit: Payer: Self-pay

## 2020-12-25 DIAGNOSIS — M19072 Primary osteoarthritis, left ankle and foot: Secondary | ICD-10-CM | POA: Diagnosis not present

## 2020-12-25 MED ORDER — DICLOFENAC SODIUM 1 % EX GEL: 4.0000 g | Freq: Four times a day (QID) | CUTANEOUS | 11 refills | Status: AC

## 2020-12-25 NOTE — Assessment & Plan Note (Addendum)
Mariah Montgomery is a very pleasant 75 year old female, I saw her about 4 years ago with a right foot ganglion cyst that responded well to an aspiration and injection. Unfortunately she has started to have pain in her left foot with fullness over the midfoot. On exam she has significant tarsometatarsal bossing, this is likely osteoarthritis, no ganglion cyst on exam. We will start conservative, custom molded orthotics, topical anti-inflammatories, x-rays. Return to see me in 3 to 4 weeks or so, and if not better we will inject the joint. Is a chronic process with exacerbation and pharmacologic management.

## 2020-12-25 NOTE — Progress Notes (Signed)
    Procedures performed today:    None.  Independent interpretation of notes and tests performed by another provider:   None.  Brief History, Exam, Impression, and Recommendations:    Primary osteoarthritis of left foot Mariah Montgomery is a very pleasant 75 year old female, I saw her about 4 years ago with a right foot ganglion cyst that responded well to an aspiration and injection. Unfortunately she has started to have pain in her left foot with fullness over the midfoot. On exam she has significant tarsometatarsal bossing, this is likely osteoarthritis, no ganglion cyst on exam. We will start conservative, custom molded orthotics, topical anti-inflammatories, x-rays. Return to see me in 3 to 4 weeks or so, and if not better we will inject the joint. Is a chronic process with exacerbation and pharmacologic management.    ___________________________________________ Ihor Austin. Benjamin Stain, M.D., ABFM., CAQSM. Primary Care and Sports Medicine [place] [facility]  Adjunct Instructor of Family Medicine  [employer]

## 2021-01-15 ENCOUNTER — Ambulatory Visit (INDEPENDENT_AMBULATORY_CARE_PROVIDER_SITE_OTHER): Payer: Medicare Other | Admitting: Sports Medicine

## 2021-01-15 ENCOUNTER — Other Ambulatory Visit: Payer: Self-pay

## 2021-01-15 DIAGNOSIS — M19072 Primary osteoarthritis, left ankle and foot: Secondary | ICD-10-CM

## 2021-01-15 NOTE — Progress Notes (Signed)
    Procedures performed today:    None.  Independent interpretation of notes and tests performed by another provider:   X-rays personally reviewed and do confirm tarsometatarsal osteoarthritis with bossing.  Brief History, Exam, Impression, and Recommendations:    Primary osteoarthritis of left foot This is a pleasant 75 year old female, we saw her for tarsometatarsal bossing on the left, we added some topical Voltaren, she returns today for the most part pain-free, she does get increasing discomfort with wearing tennis shoes, we did suggest moleskin to avoid abrasion over her bossing if she needs to wear closed toed shoes. Return as needed.    ___________________________________________ Ihor Austin. Benjamin Stain, M.D., ABFM., CAQSM. Primary Care and Sports Medicine [place] [facility]  Adjunct Instructor of Family Medicine  [employer] [facility] [employer]

## 2021-01-15 NOTE — Assessment & Plan Note (Signed)
This is a pleasant 75 year old female, we saw her for tarsometatarsal bossing on the left, we added some topical Voltaren, she returns today for the most part pain-free, she does get increasing discomfort with wearing tennis shoes, we did suggest moleskin to avoid abrasion over her bossing if she needs to wear closed toed shoes. Return as needed.

## 2021-03-28 IMAGING — DX DG LUMBAR SPINE COMPLETE 4+V
5 series · 5 of 5 positions shown · non-contrast
Comparison: No comparison studies available.

CLINICAL DATA: Right anterior hip pain radiates to the knee.

EXAM:
LUMBAR SPINE - COMPLETE 4+ VIEW

[l-spine ap]
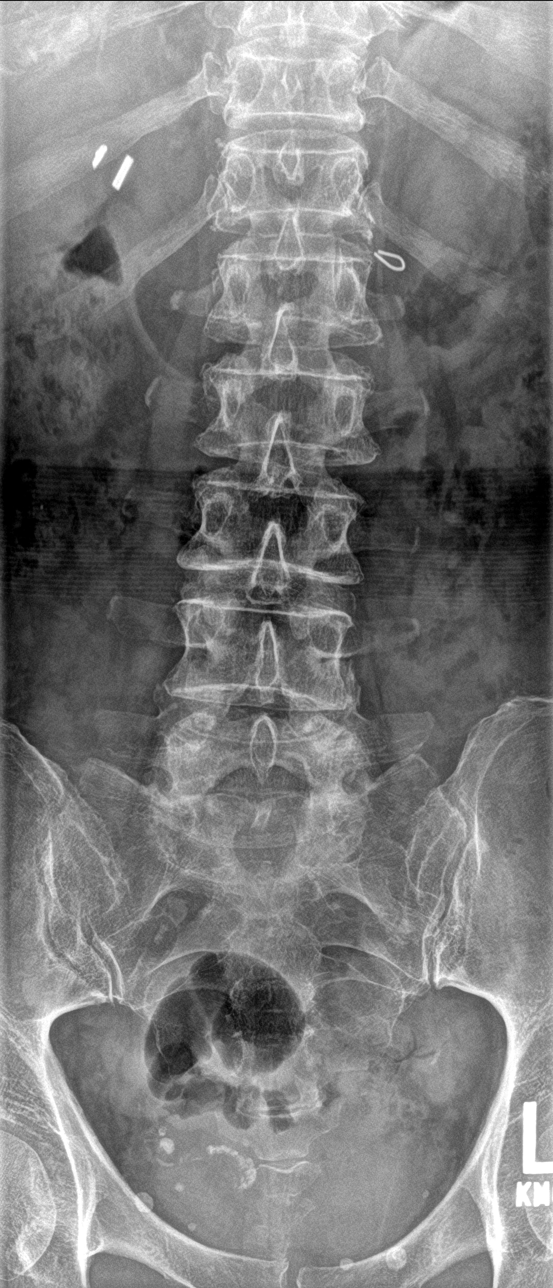

[l-spine obl (1 of 2)]
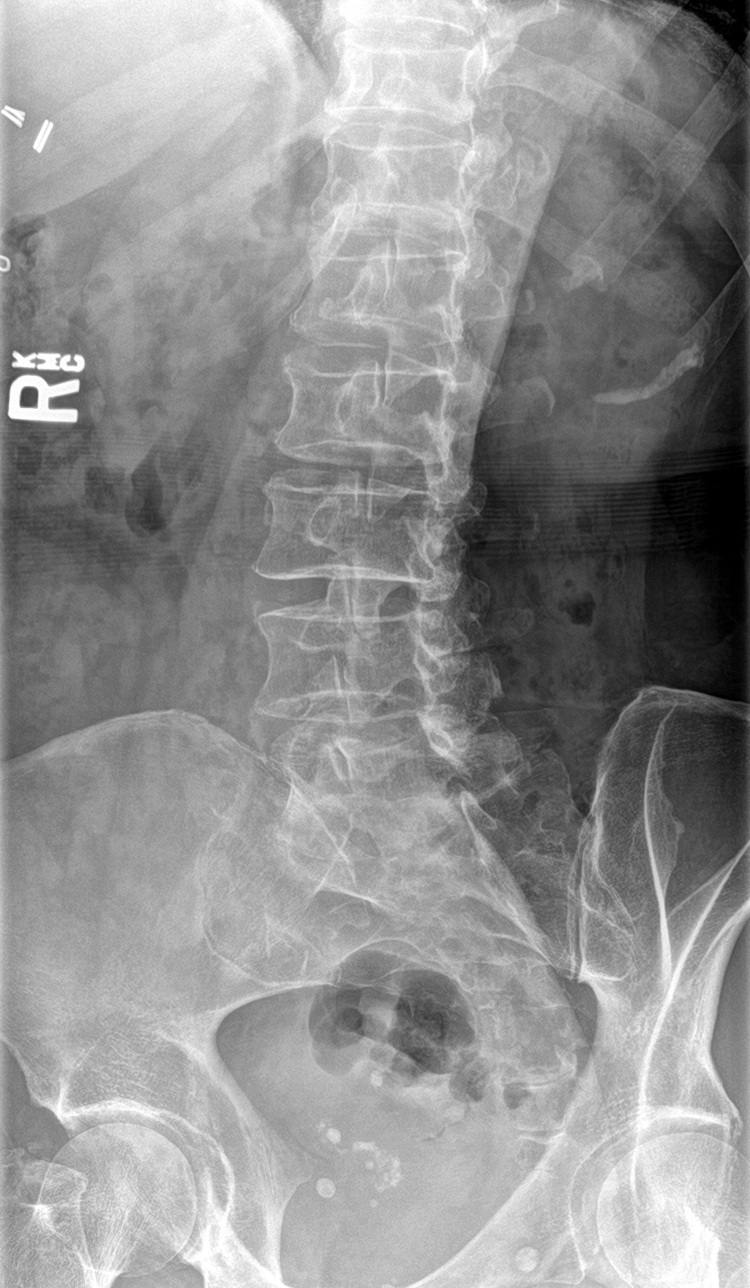

[l-spine obl (2 of 2)]
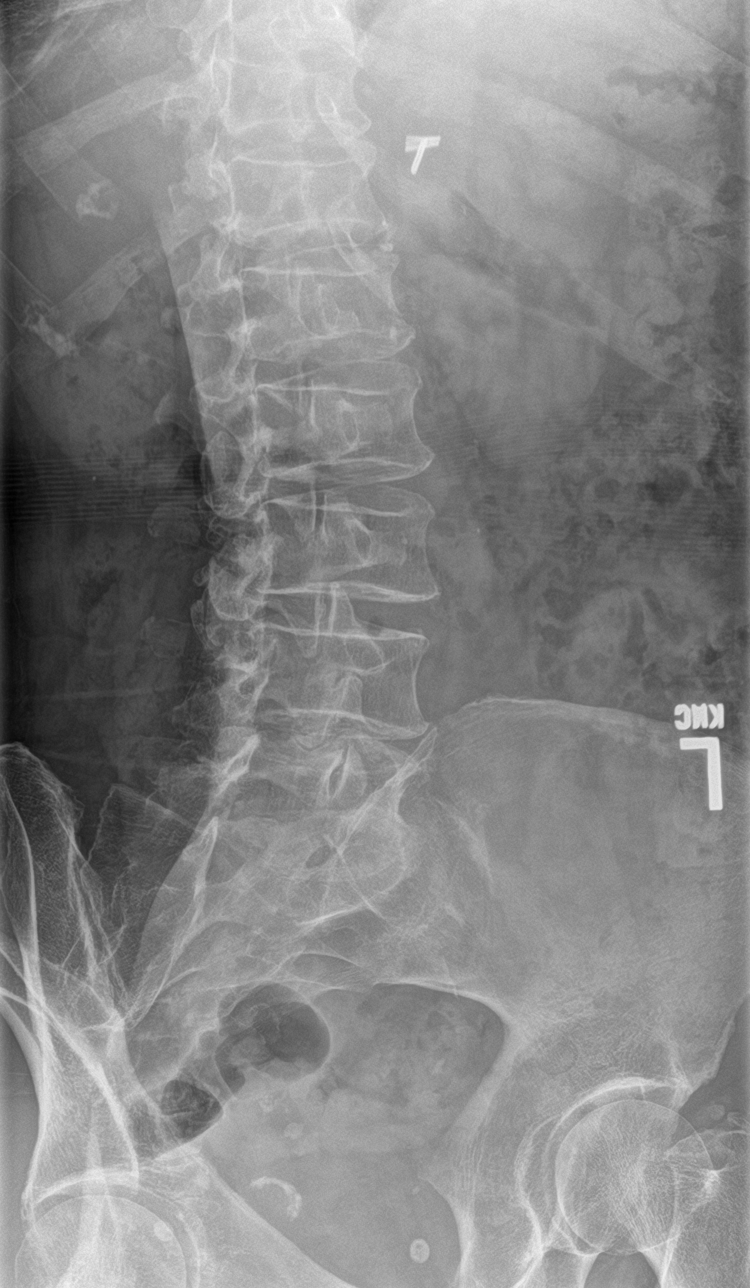

[l-spine lat]
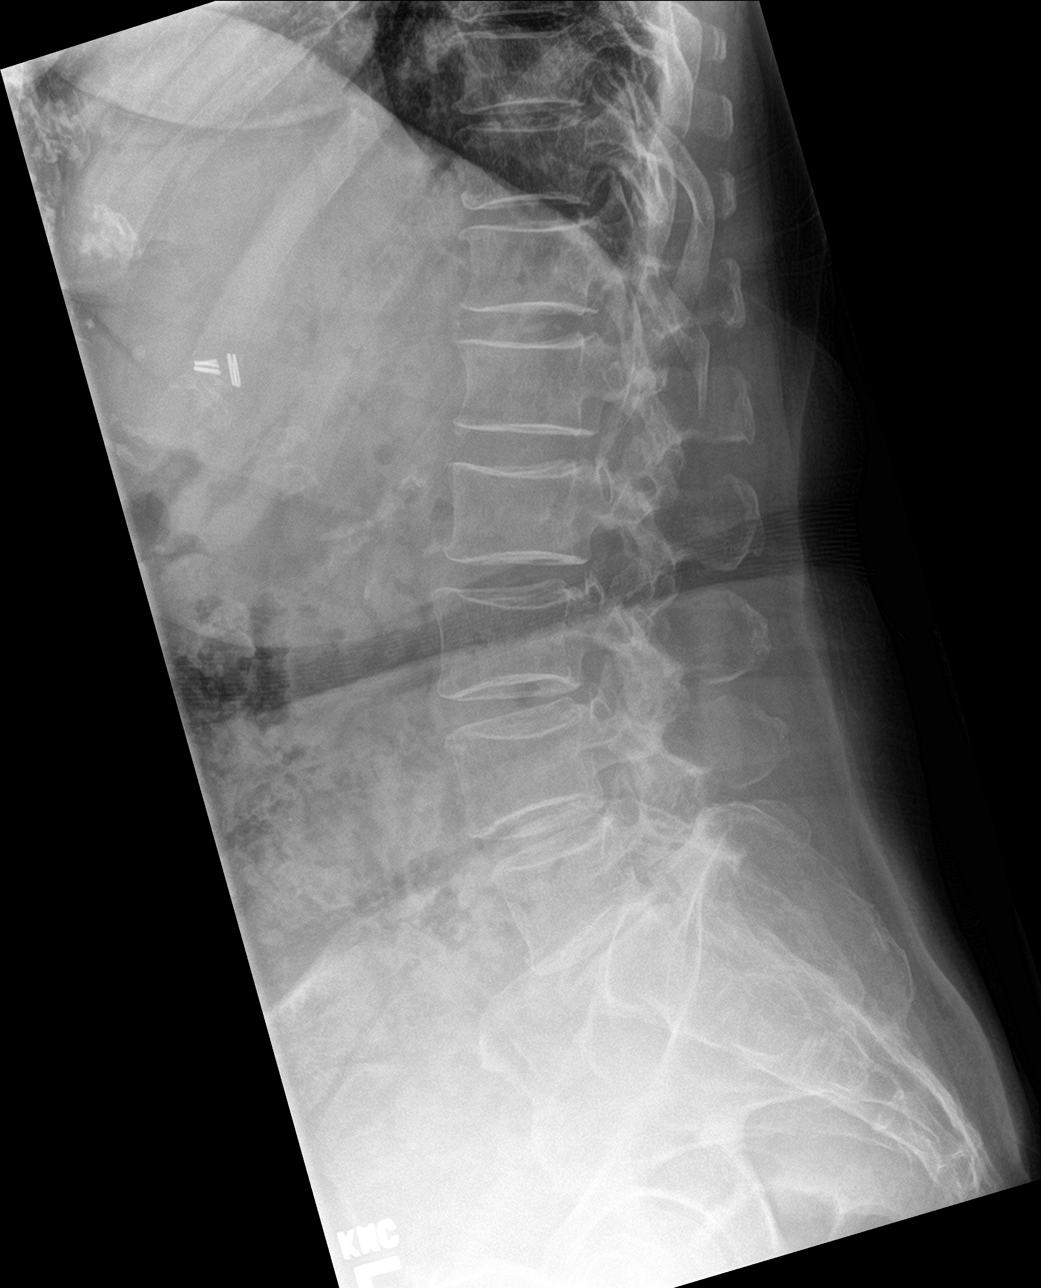

[l-spine spot]
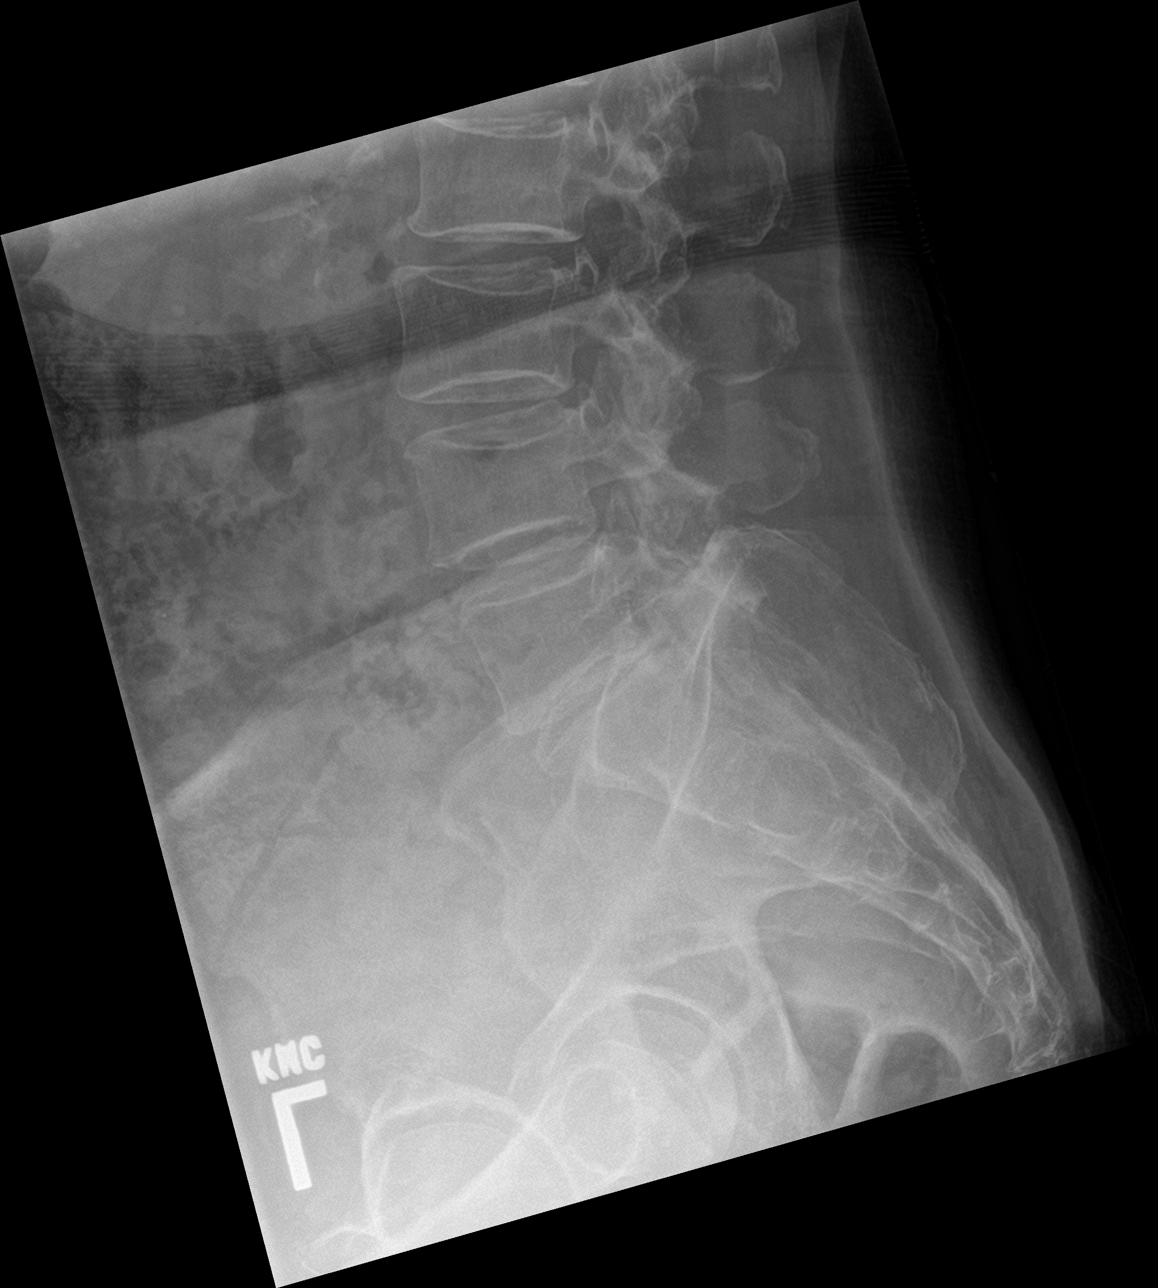

[5 of 5 positions shown; findings below may reference images not displayed]

FINDINGS: No fracture. No subluxation. Mild loss of disc height noted L3-4 and
L4-5 as well as L5-S1 facets are well aligned bilaterally with mild
degenerative changes bilaterally at L4-5. SI joints unremarkable.
IMPRESSION: Mild loss of disc height lower lumbar spine. No acute bony
abnormality.

## 2021-08-19 ENCOUNTER — Ambulatory Visit (INDEPENDENT_AMBULATORY_CARE_PROVIDER_SITE_OTHER): Payer: Medicare Other | Admitting: Sports Medicine

## 2021-08-19 ENCOUNTER — Other Ambulatory Visit: Payer: Self-pay

## 2021-08-19 DIAGNOSIS — M722 Plantar fascial fibromatosis: Secondary | ICD-10-CM

## 2021-08-19 MED ORDER — CELECOXIB 200 MG PO CAPS: ORAL_CAPSULE | ORAL | 2 refills | Status: DC

## 2021-08-19 NOTE — Assessment & Plan Note (Signed)
This is a pleasant 76 year old female, she is having recurrence of pain in her foot, left-sided on the heel worse in the morning, all consistent with plantar fasciitis. She has pes cavus, we did get her some custom orthotics years ago, she feels as though she is forced into oversupination, she should bring these back next time she sees me and we can try to modify them. In the meantime we will do avoidance of barefoot walking, home conditioning, Celebrex, and an air heel brace. Return to see me in 4 weeks. Injection +/- new set of orthotics if not better.

## 2021-08-19 NOTE — Progress Notes (Signed)
° ° °  Procedures performed today:    None.  Independent interpretation of notes and tests performed by another provider:   None.  Brief History, Exam, Impression, and Recommendations:    Plantar fasciitis, bilateral This is a pleasant 76 year old female, she is having recurrence of pain in her foot, left-sided on the heel worse in the morning, all consistent with plantar fasciitis. She has pes cavus, we did get her some custom orthotics years ago, she feels as though she is forced into oversupination, she should bring these back next time she sees me and we can try to modify them. In the meantime we will do avoidance of barefoot walking, home conditioning, Celebrex, and an air heel brace. Return to see me in 4 weeks. Injection +/- new set of orthotics if not better.    ___________________________________________ Ihor Austin. Benjamin Stain, M.D., ABFM., CAQSM. Primary Care and Sports Medicine [place] [facility]  Adjunct Instructor of Family Medicine  [employer]

## 2021-09-16 ENCOUNTER — Ambulatory Visit (INDEPENDENT_AMBULATORY_CARE_PROVIDER_SITE_OTHER): Payer: Medicare Other | Admitting: Sports Medicine

## 2021-09-16 ENCOUNTER — Other Ambulatory Visit: Payer: Self-pay

## 2021-09-16 ENCOUNTER — Ambulatory Visit (INDEPENDENT_AMBULATORY_CARE_PROVIDER_SITE_OTHER): Payer: Medicare Other

## 2021-09-16 DIAGNOSIS — M722 Plantar fascial fibromatosis: Secondary | ICD-10-CM

## 2021-09-16 NOTE — Assessment & Plan Note (Addendum)
This is a very pleasant 76 year old female, known Plantar fasciitis, history of pes cavus with some custom orthotics made many years ago, they have delaminated however she could do these back together and they will still work. Have also encouraged her to use her cushion sock liner on top of the orthotic rather than the other way around. She is still having severe pain plantar fascial origin left greater than right so we injected her left plantar fascial origin today, return to see me in a month.

## 2021-09-16 NOTE — Progress Notes (Signed)
° ° °  Procedures performed today:    Procedure: Real-time Ultrasound Guided injection of the left plantar fascia origin Device: Samsung HS60  Verbal informed consent obtained.  Time-out conducted.  Noted no overlying erythema, induration, or other signs of local infection.  Skin prepped in a sterile fashion.  Local anesthesia: Topical Ethyl chloride.  With sterile technique and under real time ultrasound guidance: Noted thickened plantar fascia, 1 cc Kenalog 40, 1 cc lidocaine, 1 cc bupivacaine injected easily Completed without difficulty  Advised to call if fevers/chills, erythema, induration, drainage, or persistent bleeding.  Images permanently stored and available for review in PACS.  Impression: Technically successful ultrasound guided injection.  Independent interpretation of notes and tests performed by another provider:   None.  Brief History, Exam, Impression, and Recommendations:    Plantar fasciitis, bilateral This is a very pleasant 76 year old female, known Plantar fasciitis, history of pes cavus with some custom orthotics made many years ago, they have delaminated however she could do these back together and they will still work. Have also encouraged her to use her cushion sock liner on top of the orthotic rather than the other way around. She is still having severe pain plantar fascial origin left greater than right so we injected her left plantar fascial origin today, return to see me in a month.    ___________________________________________ Ihor Austin. Benjamin Stain, M.D., ABFM., CAQSM. Primary Care and Sports Medicine [place] [facility]  Adjunct Instructor of Family Medicine  [employer] [facility] [employer] [facility] [employer]

## 2021-10-14 ENCOUNTER — Ambulatory Visit: Payer: Medicare Other | Admitting: Sports Medicine

## 2022-04-15 ENCOUNTER — Ambulatory Visit (INDEPENDENT_AMBULATORY_CARE_PROVIDER_SITE_OTHER): Payer: Medicare Other

## 2022-04-15 ENCOUNTER — Ambulatory Visit (INDEPENDENT_AMBULATORY_CARE_PROVIDER_SITE_OTHER): Payer: Medicare Other | Admitting: Sports Medicine

## 2022-04-15 DIAGNOSIS — M25872 Other specified joint disorders, left ankle and foot: Secondary | ICD-10-CM

## 2022-04-15 MED ORDER — PREDNISONE 50 MG PO TABS: ORAL_TABLET | ORAL | 0 refills | Status: DC

## 2022-04-15 NOTE — Progress Notes (Signed)
    Procedures performed today:    None.  Independent interpretation of notes and tests performed by another provider:   None.  Brief History, Exam, Impression, and Recommendations:    Sesamoiditis of left foot Pleasant 76 year old female, she has had several months of pain and swelling plantar aspect left first MTP. On exam she has significant swelling and tenderness at the medial sesamoid. We will add a postop shoe with a dancers pad, 5 days of prednisone, x-rays, return to see me in approximately 4 weeks, 6 weeks, MRI for injection planning if not better.  Chronic process not to goal with pharmacologic intervention  ____________________________________________ Gwen Her. Dianah Field, M.D., ABFM., CAQSM., AME. Primary Care and Sports Medicine [place] [facility]  Adjunct Professor of [place] [employer] [facility] of Medicine  Risk manager

## 2022-04-15 NOTE — Assessment & Plan Note (Signed)
Pleasant 76 year old female, she has had several months of pain and swelling plantar aspect left first MTP. On exam she has significant swelling and tenderness at the medial sesamoid. We will add a postop shoe with a dancers pad, 5 days of prednisone, x-rays, return to see me in approximately 4 weeks, 6 weeks, MRI for injection planning if not better.

## 2022-05-27 ENCOUNTER — Ambulatory Visit (INDEPENDENT_AMBULATORY_CARE_PROVIDER_SITE_OTHER): Payer: Medicare Other | Admitting: Sports Medicine

## 2022-05-27 ENCOUNTER — Encounter: Payer: Self-pay | Admitting: Sports Medicine

## 2022-05-27 VITALS — Wt 152.0 lb

## 2022-05-27 DIAGNOSIS — M25872 Other specified joint disorders, left ankle and foot: Secondary | ICD-10-CM | POA: Diagnosis not present

## 2022-05-27 NOTE — Assessment & Plan Note (Signed)
This pleasant 76 year old female returns, she had months of swelling and pain left first MTP, she had some swelling and tenderness at the medial sesamoid at the last visit, we added a postop shoe and a dancers pad, 5 days of prednisone, x-rays did show first MTP osteoarthritis, she returns and is feeling significantly better, still has some discomfort but I think we can see her back as needed. If recurrence of pain we would likely proceed with an MRI before considering subsesamoid injection versus first MTP injection.

## 2022-05-27 NOTE — Progress Notes (Signed)
    Procedures performed today:    None.  Independent interpretation of notes and tests performed by another provider:   None.  Brief History, Exam, Impression, and Recommendations:    Sesamoiditis of left foot This pleasant 76 year old female returns, she had months of swelling and pain left first MTP, she had some swelling and tenderness at the medial sesamoid at the last visit, we added a postop shoe and a dancers pad, 5 days of prednisone, x-rays did show first MTP osteoarthritis, she returns and is feeling significantly better, still has some discomfort but I think we can see her back as needed. If recurrence of pain we would likely proceed with an MRI before considering subsesamoid injection versus first MTP injection.    ____________________________________________ Gwen Her. Dianah Field, M.D., ABFM., CAQSM., AME. Primary Care and Sports Medicine [place] [facility]  Adjunct Professor of [place] [employer] [facility] of Medicine  Risk manager

## 2022-10-15 ENCOUNTER — Ambulatory Visit (INDEPENDENT_AMBULATORY_CARE_PROVIDER_SITE_OTHER): Payer: Medicare Other

## 2022-10-15 ENCOUNTER — Ambulatory Visit (INDEPENDENT_AMBULATORY_CARE_PROVIDER_SITE_OTHER): Payer: Medicare Other | Admitting: Sports Medicine

## 2022-10-15 DIAGNOSIS — M47897 Other spondylosis, lumbosacral region: Secondary | ICD-10-CM

## 2022-10-15 DIAGNOSIS — M25551 Pain in right hip: Secondary | ICD-10-CM | POA: Diagnosis not present

## 2022-10-15 MED ORDER — PREDNISONE 50 MG PO TABS: ORAL_TABLET | ORAL | 0 refills | Status: DC

## 2022-10-15 NOTE — Assessment & Plan Note (Signed)
Pleasant 77 year old female, we have treated her in the past for lumbar radiculitis, more recently she is having increasing pain right low back and localized at the sacroiliac joint worse with walking, occasional popping sensations, hip exam is normal, she did have discrete pain directly over the right sacroiliac joint, no pain midline or paralumbar musculature. We discussed the anatomy, pathophysiology, will do x-rays of the lumbar spine and SI joints, adding 5 days of prednisone, formal PT, return to see me in 6 weeks, SI joint injection if not better.

## 2022-10-15 NOTE — Progress Notes (Signed)
    Procedures performed today:    None.  Independent interpretation of notes and tests performed by another provider:   None.  Brief History, Exam, Impression, and Recommendations:    Lumbosacral spondylosis with SI joint dysfunction, right Pleasant 77 year old female, we have treated her in the past for lumbar radiculitis, more recently she is having increasing pain right low back and localized at the sacroiliac joint worse with walking, occasional popping sensations, hip exam is normal, she did have discrete pain directly over the right sacroiliac joint, no pain midline or paralumbar musculature. We discussed the anatomy, pathophysiology, will do x-rays of the lumbar spine and SI joints, adding 5 days of prednisone, formal PT, return to see me in 6 weeks, SI joint injection if not better.    ____________________________________________ Gwen Her. Dianah Field, M.D., ABFM., CAQSM., AME. Primary Care and Sports Medicine [place] [facility]  Adjunct Professor of [place] of [facility] of Medicine  Risk manager

## 2022-10-30 ENCOUNTER — Ambulatory Visit: Payer: Medicare Other | Attending: Sports Medicine | Admitting: Physical Therapy

## 2022-10-30 ENCOUNTER — Other Ambulatory Visit: Payer: Self-pay

## 2022-10-30 ENCOUNTER — Encounter: Payer: Self-pay | Admitting: Physical Therapy

## 2022-10-30 DIAGNOSIS — M6281 Muscle weakness (generalized): Secondary | ICD-10-CM

## 2022-10-30 DIAGNOSIS — R29898 Other symptoms and signs involving the musculoskeletal system: Secondary | ICD-10-CM

## 2022-10-30 DIAGNOSIS — M47897 Other spondylosis, lumbosacral region: Secondary | ICD-10-CM | POA: Diagnosis not present

## 2022-10-30 DIAGNOSIS — M5459 Other low back pain: Secondary | ICD-10-CM

## 2022-10-30 NOTE — Therapy (Signed)
OUTPATIENT PHYSICAL THERAPY THORACOLUMBAR EVALUATION   Patient Name: Mariah Montgomery MRN: [id number] DOB:1946/01/10, 77 y.o., female Today's Date: 10/30/2022  END OF SESSION:  PT End of Session - 10/30/22 1551     Visit Number 1    Number of Visits 13    Date for PT Re-Evaluation 12/11/22    Authorization Type [employer] MCR    Authorization Time Period 10/30/22 to 12/11/22    Progress Note Due on Visit 10    PT Start Time 1402    PT Stop Time 1441    PT Time Calculation (min) 39 min    Activity Tolerance Patient tolerated treatment well    Behavior During Therapy [facility] for tasks assessed/performed             History reviewed. No pertinent past medical history. History reviewed. No pertinent surgical history. Patient Active Problem List   Diagnosis Date Noted   Sesamoiditis of left foot 04/15/2022   Primary osteoarthritis of left foot 12/25/2020   Lumbosacral spondylosis with SI joint dysfunction, right 06/03/2019   Primary osteoarthritis of right knee 06/16/2017   Prepatellar bursitis, right knee 05/15/2017   Ganglion cyst of right foot 05/15/2017   Plantar fasciitis, bilateral 05/15/2017    PCP: Cathrine Muster, B MD   REFERRING PROVIDER: Monica Becton, MD  REFERRING DIAG: [phone] (ICD-10-CM) - Other osteoarthritis of spine, lumbosacral region  Rationale for Evaluation and Treatment: Rehabilitation  THERAPY DIAG:  Other low back pain  Muscle weakness (generalized)  Other symptoms and signs involving the musculoskeletal system  ONSET DATE: 10/15/2022  SUBJECTIVE:                                                                                                                                                                                           SUBJECTIVE STATEMENT:  I've had some issues with this pain reoccurring for the past 20 years, it stops and starts. Had some needling on the thigh in the past, the doctor said "this was part of it".  Its one spot in  my back on the right side. No falls or hard steps off of sidewalks or anything like that. Had therapy for this in the past. When this came back around this time, it was around 9/10 but I've been working on it to get it better, doing a lot of yoga stretches from old HEP.   PERTINENT HISTORY:  Lumbosacral spondylosis with SI joint dysfunction, right Pleasant 77 year old female, we have treated her in the past for lumbar radiculitis, more recently she is having increasing pain right low back and localized at the sacroiliac joint worse with walking,  occasional popping sensations, hip exam is normal, she did have discrete pain directly over the right sacroiliac joint, no pain midline or paralumbar musculature. We discussed the anatomy, pathophysiology, will do x-rays of the lumbar spine and SI joints, adding 5 days of prednisone, formal PT, return to see me in 6 weeks, SI joint injection if not better.  PAIN:  Are you having pain? Yes: NPRS scale: 2/10; 5/10 pain at worst  Pain location: R SI/low back  Pain description: "I just know its there, its always just there"  Aggravating factors: certain leg movements , unsure specifically what  Relieving factors: stretches/exercise   PRECAUTIONS: None  WEIGHT BEARING RESTRICTIONS: No  FALLS:  Has patient fallen in last 6 months? No  LIVING ENVIRONMENT: Lives with: lives with their spouse Lives in: House/apartment Stairs: 3 STE no rails, 3 story home with rails  Has following equipment at home: None  OCCUPATION: retired   PLOF: Independent, Independent with basic ADLs, Independent with gait, and Independent with transfers  PATIENT GOALS: be pain free   NEXT MD VISIT: Dr. Karie Schwalbe May 8th  OBJECTIVE:   DIAGNOSTIC FINDINGS:  FINDINGS: The sacroiliac joint spaces are maintained and there is no evidence of arthropathy. No other bone abnormalities are seen.   IMPRESSION: Negative.  FINDINGS: Mild leftward curvature of the mid lumbar spine.  Preservation of the vertebral body heights. Degenerative disc disease most pronounced L4-5 and L5-S1. Lower lumbar spine facet degenerative changes. Lower thoracic spine degenerative changes.   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  PATIENT SURVEYS:  FOTO will do 2nd visit   SCREENING FOR RED FLAGS: Bowel or bladder incontinence: No Spinal tumors: No Cauda equina syndrome: No Compression fracture: No Abdominal aneurysm: No  COGNITION: Overall cognitive status: Within functional limits for tasks assessed     SENSATION: Some altered sensation anterior R thigh, "its different, more like a sore muscle I've not used in awhile"   MUSCLE LENGTH: Quads mild limitation B Hip flexors mild limitation B  HS L mild limitation, R moderate limitation Piriformis L mild limitation, R moderate limitation  LLD with R LE less than 1/8 inch longer   POSTURE: rounded shoulders, forward head, decreased lumbar lordosis, and increased thoracic kyphosis  PALPATION:  B lumbar paraspinals in spasm R side of pelvis appears to be rotated forward, consistent with LLD findings    LUMBAR ROM:   AROM eval  Flexion WNL   Extension WNL pain in R SIJ   Right lateral flexion WNL   Left lateral flexion WNL   Right rotation   Left rotation    (Blank rows = not tested)  LOWER EXTREMITY ROM:     Active  Right eval Left eval  Hip flexion    Hip extension    Hip abduction    Hip adduction    Hip internal rotation    Hip external rotation    Knee flexion    Knee extension    Ankle dorsiflexion    Ankle plantarflexion    Ankle inversion    Ankle eversion     (Blank rows = not tested)  LOWER EXTREMITY MMT:    MMT Right eval Left eval  Hip flexion 4+ 4+  Hip extension 3+ 3+  Hip abduction 4 4+  Hip adduction    Hip internal rotation    Hip external rotation    Knee flexion 4 4  Knee extension 4+ 4+  Ankle dorsiflexion 5 5  Ankle plantarflexion  Ankle inversion    Ankle eversion     (Blank rows = not tested)  LUMBAR SPECIAL TESTS:  Straight leg raise test: Negative  FUNCTIONAL TESTS:    GAIT:  TODAY'S TREATMENT:                                                                                                                              DATE:   Eval  Objective measures/appropriate education/HEP creation  TherEx  Nustep L6x6 minutes BLEs only   SI MET for R anterior rotation Bridges + ABD into red TB x12 R HS stretch 1x30 seconds      PATIENT EDUCATION:  Education details: exam findings, POC, HEP  Person educated: Patient Education method: Programmer, multimedia, Demonstration, and Handouts Education comprehension: verbalized understanding, returned demonstration, and needs further education  HOME EXERCISE PROGRAM: Access Code: [email or web address] URL: [email or web address] Date: 10/30/2022 Prepared by: Nedra Hai  Exercises - Supine SI Joint Self-Correction  - 1-2 x daily - 7 x weekly - 1 sets - 6 reps - 5 hold - Supine Bridge with Resistance Band  - 2 x daily - 7 x weekly - 1 sets - 10 reps - 3-5 hold - Seated Hamstring Stretch  - 2 x daily - 7 x weekly - 1 sets - 2 reps - 30 hold  ASSESSMENT:  CLINICAL IMPRESSION: Patient is a 77 y.o. F who was seen today for physical therapy evaluation and treatment for lumbar and SI OA. Of note, there was some concern for SI involvement in MD note- PT findings and patient history consistent with this. Exam reveals LLD with R side of pelvis rotated anteriorly, functional muscle weakness, impaired mm flexibility, poor core strength, and poor biomechanics. Will benefit from skilled PT services to address functional impairments, reduce pain, and educate her on management techniques to prevent recurrence of pain.   OBJECTIVE IMPAIRMENTS: difficulty walking, decreased ROM, decreased strength, hypomobility, impaired flexibility, postural dysfunction, and pain.   ACTIVITY  LIMITATIONS: sitting, standing, squatting, stairs, transfers, bed mobility, and locomotion level  PARTICIPATION LIMITATIONS: driving, shopping, community activity, and yard work  PERSONAL FACTORS: Age, Behavior pattern, Education, Fitness, Past/current experiences, and Time since onset of injury/illness/exacerbation are also affecting patient's functional outcome.   REHAB POTENTIAL: Good  CLINICAL DECISION MAKING: Stable/uncomplicated  EVALUATION COMPLEXITY: Low   GOALS: Goals reviewed with patient? Yes  SHORT TERM GOALS: Target date: 11/20/2022    Will be compliant with appropriate progressive HEP  Baseline: Goal status: INITIAL  2.  Will be independent with self SI re-alignment techniques  Baseline:  Goal status: INITIAL  3.  Pain to be resolved, 0/10 Baseline:  Goal status: INITIAL   LONG TERM GOALS: Target date: 12/11/2022    MMT to improve by at least 1 grade in all weak groups  Baseline:  Goal status: INITIAL  2.  Muscle flexibility impairments to have resolved  Baseline:  Goal status: INITIAL  3.  Will be independent in advanced HEP  vs gym program with focus on preventing recurrence of SI misalignment/pain  Baseline:  Goal status: INITIAL   PLAN:  PT FREQUENCY: 2x/week  PT DURATION: 6 weeks  PLANNED INTERVENTIONS: Therapeutic exercises, Therapeutic activity, Neuromuscular re-education, Balance training, Gait training, Patient/Family education, Self Care, Joint mobilization, Stair training, Aquatic Therapy, Electrical stimulation, Spinal mobilization, Cryotherapy, Moist heat, Taping, Ultrasound, Ionotophoresis 4mg /ml Dexamethasone, Manual therapy, and Re-evaluation.  PLAN FOR NEXT SESSION: check SI alignment, address PRN. Otherwise flexibility and general/core strength as appropriate   Nedra Hai PT DPT PN2

## 2022-11-03 ENCOUNTER — Encounter: Payer: Self-pay | Admitting: Physical Therapy

## 2022-11-03 ENCOUNTER — Ambulatory Visit: Payer: Medicare Other | Admitting: Physical Therapy

## 2022-11-03 DIAGNOSIS — M5459 Other low back pain: Secondary | ICD-10-CM | POA: Diagnosis not present

## 2022-11-03 DIAGNOSIS — R29898 Other symptoms and signs involving the musculoskeletal system: Secondary | ICD-10-CM

## 2022-11-03 DIAGNOSIS — M6281 Muscle weakness (generalized): Secondary | ICD-10-CM

## 2022-11-03 NOTE — Therapy (Signed)
OUTPATIENT PHYSICAL THERAPY THORACOLUMBAR TREATMENT   Patient Name: Mariah Montgomery MRN: [id number] DOB:18-Apr-1946, 77 y.o., female Today's Date: 11/03/2022  END OF SESSION:  PT End of Session - 11/03/22 1023     Visit Number 2    Number of Visits 13    Date for PT Re-Evaluation 12/11/22    Authorization Type [employer] MCR    Authorization Time Period 10/30/22 to 12/11/22    Progress Note Due on Visit 10    PT Start Time 1017    PT Stop Time 1056    PT Time Calculation (min) 39 min    Activity Tolerance Patient tolerated treatment well    Behavior During Therapy [facility] for tasks assessed/performed              History reviewed. No pertinent past medical history. History reviewed. No pertinent surgical history. Patient Active Problem List   Diagnosis Date Noted   Sesamoiditis of left foot 04/15/2022   Primary osteoarthritis of left foot 12/25/2020   Lumbosacral spondylosis with SI joint dysfunction, right 06/03/2019   Primary osteoarthritis of right knee 06/16/2017   Prepatellar bursitis, right knee 05/15/2017   Ganglion cyst of right foot 05/15/2017   Plantar fasciitis, bilateral 05/15/2017    PCP: Cathrine Muster, B MD   REFERRING PROVIDER: Monica Becton, MD  REFERRING DIAG: [phone] (ICD-10-CM) - Other osteoarthritis of spine, lumbosacral region  Rationale for Evaluation and Treatment: Rehabilitation  THERAPY DIAG:  Other low back pain  Muscle weakness (generalized)  Other symptoms and signs involving the musculoskeletal system  ONSET DATE: 10/15/2022  SUBJECTIVE:                                                                                                                                                                                           SUBJECTIVE STATEMENT:  It doesn't hurt but I'm sore. I've been trying to do the HEP every day. Its much better in general.   PERTINENT HISTORY:  Lumbosacral spondylosis with SI joint dysfunction,  right Pleasant 77 year old female, we have treated her in the past for lumbar radiculitis, more recently she is having increasing pain right low back and localized at the sacroiliac joint worse with walking, occasional popping sensations, hip exam is normal, she did have discrete pain directly over the right sacroiliac joint, no pain midline or paralumbar musculature. We discussed the anatomy, pathophysiology, will do x-rays of the lumbar spine and SI joints, adding 5 days of prednisone, formal PT, return to see me in 6 weeks, SI joint injection if not better.  PAIN:  Are you having pain? Yes: NPRS scale: 1/10; 3/10 pain at  worst  Pain location: R SI Pain description: "I just know its there, its always just there"  Aggravating factors: has a mind of its own now  Relieving factors: exercises  PRECAUTIONS: None  WEIGHT BEARING RESTRICTIONS: No  FALLS:  Has patient fallen in last 6 months? No  LIVING ENVIRONMENT: Lives with: lives with their spouse Lives in: House/apartment Stairs: 3 STE no rails, 3 story home with rails  Has following equipment at home: None  OCCUPATION: retired   PLOF: Independent, Independent with basic ADLs, Independent with gait, and Independent with transfers  PATIENT GOALS: be pain free   NEXT MD VISIT: Dr. Karie Schwalbe May 8th  OBJECTIVE:   DIAGNOSTIC FINDINGS:  FINDINGS: The sacroiliac joint spaces are maintained and there is no evidence of arthropathy. No other bone abnormalities are seen.   IMPRESSION: Negative.  FINDINGS: Mild leftward curvature of the mid lumbar spine. Preservation of the vertebral body heights. Degenerative disc disease most pronounced L4-5 and L5-S1. Lower lumbar spine facet degenerative changes. Lower thoracic spine degenerative changes.   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  PATIENT SURVEYS:  FOTO will do 2nd visit   SCREENING FOR RED FLAGS: Bowel or bladder incontinence: No Spinal tumors:  No Cauda equina syndrome: No Compression fracture: No Abdominal aneurysm: No  COGNITION: Overall cognitive status: Within functional limits for tasks assessed     SENSATION: Some altered sensation anterior R thigh, "its different, more like a sore muscle I've not used in awhile"   MUSCLE LENGTH: Quads mild limitation B Hip flexors mild limitation B  HS L mild limitation, R moderate limitation Piriformis L mild limitation, R moderate limitation  LLD with R LE less than 1/8 inch longer   POSTURE: rounded shoulders, forward head, decreased lumbar lordosis, and increased thoracic kyphosis  PALPATION:  B lumbar paraspinals in spasm R side of pelvis appears to be rotated forward, consistent with LLD findings    LUMBAR ROM:   AROM eval  Flexion WNL   Extension WNL pain in R SIJ   Right lateral flexion WNL   Left lateral flexion WNL   Right rotation   Left rotation    (Blank rows = not tested)  LOWER EXTREMITY ROM:     Active  Right eval Left eval  Hip flexion    Hip extension    Hip abduction    Hip adduction    Hip internal rotation    Hip external rotation    Knee flexion    Knee extension    Ankle dorsiflexion    Ankle plantarflexion    Ankle inversion    Ankle eversion     (Blank rows = not tested)  LOWER EXTREMITY MMT:    MMT Right eval Left eval  Hip flexion 4+ 4+  Hip extension 3+ 3+  Hip abduction 4 4+  Hip adduction    Hip internal rotation    Hip external rotation    Knee flexion 4 4  Knee extension 4+ 4+  Ankle dorsiflexion 5 5  Ankle plantarflexion    Ankle inversion    Ankle eversion     (Blank rows = not tested)  LUMBAR SPECIAL TESTS:  Straight leg raise test: Negative  FUNCTIONAL TESTS:    GAIT:  TODAY'S TREATMENT:  DATE:   11/03/22  TherEx  Nustep L6x6 minutes BLEs only Checked SI- in good  alignment today HS stretches supine with sheet 3x30 seconds B Piriformis stretches 3x30 seconds B Quad stretches 3x30 seconds B   Bridges into red TB x15 with 3 second holds  Sidelying hip ABD red TB x10 B Hip hikes x10 B standing   Eval  Objective measures/appropriate education/HEP creation  TherEx  Nustep L6x6 minutes BLEs only   SI MET for R anterior rotation Bridges + ABD into red TB x12 R HS stretch 1x30 seconds      PATIENT EDUCATION:  Education details: exam findings, POC, HEP  Person educated: Patient Education method: Programmer, multimedia, Demonstration, and Handouts Education comprehension: verbalized understanding, returned demonstration, and needs further education  HOME EXERCISE PROGRAM: Access Code: [facility] URL: [email or web address] Date: 10/30/2022 Prepared by: Nedra Hai  Exercises - Supine SI Joint Self-Correction  - 1-2 x daily - 7 x weekly - 1 sets - 6 reps - 5 hold - Supine Bridge with Resistance Band  - 2 x daily - 7 x weekly - 1 sets - 10 reps - 3-5 hold - Seated Hamstring Stretch  - 2 x daily - 7 x weekly - 1 sets - 2 reps - 30 hold  ASSESSMENT:  CLINICAL IMPRESSION:  Melisa arrives doing well, sounds like her pain/intensity of pain has gotten much better recently. Warmed up on Nustep then rechecked SI alignment, otherwise focused on functional strength and flexibility today. Tolerated session well today.   OBJECTIVE IMPAIRMENTS: difficulty walking, decreased ROM, decreased strength, hypomobility, impaired flexibility, postural dysfunction, and pain.   ACTIVITY LIMITATIONS: sitting, standing, squatting, stairs, transfers, bed mobility, and locomotion level  PARTICIPATION LIMITATIONS: driving, shopping, community activity, and yard work  PERSONAL FACTORS: Age, Behavior pattern, Education, Fitness, Past/current experiences, and Time since onset of injury/illness/exacerbation are also affecting patient's functional outcome.   REHAB  POTENTIAL: Good  CLINICAL DECISION MAKING: Stable/uncomplicated  EVALUATION COMPLEXITY: Low   GOALS: Goals reviewed with patient? Yes  SHORT TERM GOALS: Target date: 11/20/2022    Will be compliant with appropriate progressive HEP  Baseline: Goal status: INITIAL  2.  Will be independent with self SI re-alignment techniques  Baseline:  Goal status: INITIAL  3.  Pain to be resolved, 0/10 Baseline:  Goal status: INITIAL   LONG TERM GOALS: Target date: 12/11/2022    MMT to improve by at least 1 grade in all weak groups  Baseline:  Goal status: INITIAL  2.  Muscle flexibility impairments to have resolved  Baseline:  Goal status: INITIAL  3.  Will be independent in advanced HEP vs gym program with focus on preventing recurrence of SI misalignment/pain  Baseline:  Goal status: INITIAL   PLAN:  PT FREQUENCY: 2x/week  PT DURATION: 6 weeks  PLANNED INTERVENTIONS: Therapeutic exercises, Therapeutic activity, Neuromuscular re-education, Balance training, Gait training, Patient/Family education, Self Care, Joint mobilization, Stair training, Aquatic Therapy, Electrical stimulation, Spinal mobilization, Cryotherapy, Moist heat, Taping, Ultrasound, Ionotophoresis /ml Dexamethasone, Manual therapy, and Re-evaluation.  PLAN FOR NEXT SESSION: check SI alignment, address PRN. Otherwise flexibility and general/core strength as appropriate. HEP updates.   Nedra Hai PT DPT PN2

## 2022-11-05 ENCOUNTER — Encounter: Payer: Medicare Other | Admitting: Rehabilitative and Restorative Service Providers"

## 2022-11-11 ENCOUNTER — Ambulatory Visit: Payer: Medicare Other | Admitting: Rehabilitative and Restorative Service Providers"

## 2022-11-11 ENCOUNTER — Encounter: Payer: Self-pay | Admitting: Rehabilitative and Restorative Service Providers"

## 2022-11-11 DIAGNOSIS — M6281 Muscle weakness (generalized): Secondary | ICD-10-CM

## 2022-11-11 DIAGNOSIS — M5459 Other low back pain: Secondary | ICD-10-CM

## 2022-11-11 DIAGNOSIS — R29898 Other symptoms and signs involving the musculoskeletal system: Secondary | ICD-10-CM

## 2022-11-11 NOTE — Therapy (Signed)
OUTPATIENT PHYSICAL THERAPY THORACOLUMBAR TREATMENT   Patient Name: Mariah Montgomery MRN: [id number] DOB:[birth year], 77 y.o., female Today's Date: 11/11/2022  END OF SESSION:  PT End of Session - 11/11/22 1444     Visit Number 3    Number of Visits 13    Date for PT Re-Evaluation 12/11/22    Authorization Type UHC MCR    Authorization Time Period 10/30/22 to 12/11/22    Progress Note Due on Visit 10    PT Start Time 1444    PT Stop Time 1530    PT Time Calculation (min) 46 min    Activity Tolerance Patient tolerated treatment well              History reviewed. No pertinent past medical history. History reviewed. No pertinent surgical history. Patient Active Problem List   Diagnosis Date Noted   Sesamoiditis of left foot 04/15/2022   Primary osteoarthritis of left foot 12/25/2020   Lumbosacral spondylosis with SI joint dysfunction, right 06/03/2019   Primary osteoarthritis of right knee 06/16/2017   Prepatellar bursitis, right knee 05/15/2017   Ganglion cyst of right foot 05/15/2017   Plantar fasciitis, bilateral 05/15/2017    PCP: Cathrine Muster, B MD   REFERRING PROVIDER: Monica Becton, MD  REFERRING DIAG: [phone] (ICD-10-CM) - Other osteoarthritis of spine, lumbosacral region  Rationale for Evaluation and Treatment: Rehabilitation  THERAPY DIAG:  Other low back pain  Muscle weakness (generalized)  Other symptoms and signs involving the musculoskeletal system  ONSET DATE: 10/15/2022  SUBJECTIVE:                                                                                                                                                                                           SUBJECTIVE STATEMENT:  Pain in the Rt LB/SI area is better but she is now having pain in the lateral Rt thigh. She has had this type pain in the past ~ 5 years. She is working on exercises at home.   PERTINENT HISTORY:  Lumbosacral spondylosis with SI joint dysfunction,  right Pleasant 77 year old female, we have treated her in the past for lumbar radiculitis, more recently she is having increasing pain right low back and localized at the sacroiliac joint worse with walking, occasional popping sensations, hip exam is normal, she did have discrete pain directly over the right sacroiliac joint, no pain midline or paralumbar musculature. We discussed the anatomy, pathophysiology, will do x-rays of the lumbar spine and SI joints, adding 5 days of prednisone, formal PT, return to see me in 6 weeks, SI joint injection if not better.  PAIN:  Are you having  pain? Yes: NPRS scale: 1/10; 3/10 pain at worst  Pain location: R SI Pain description: "I just know its there, its always just there"  Aggravating factors: has a mind of its own now  Relieving factors: exercises  PRECAUTIONS: None  WEIGHT BEARING RESTRICTIONS: No  FALLS:  Has patient fallen in last 6 months? No  LIVING ENVIRONMENT: Lives with: lives with their spouse Lives in: House/apartment Stairs: 3 STE no rails, 3 story home with rails  Has following equipment at home: None  OCCUPATION: retired   PLOF: Independent, Independent with basic ADLs, Independent with gait, and Independent with transfers  PATIENT GOALS: be pain free   NEXT MD VISIT: Dr. Karie Schwalbe May 8th  OBJECTIVE:   DIAGNOSTIC FINDINGS:  FINDINGS: The sacroiliac joint spaces are maintained and there is no evidence of arthropathy. No other bone abnormalities are seen.   IMPRESSION: Negative.  FINDINGS: Mild leftward curvature of the mid lumbar spine. Preservation of the vertebral body heights. Degenerative disc disease most pronounced L4-5 and L5-S1. Lower lumbar spine facet degenerative changes. Lower thoracic spine degenerative changes.   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  PATIENT SURVEYS:  FOTO will do 2nd visit   SENSATION: Some altered sensation anterior R thigh, "its different, more like  a sore muscle I've not used in awhile"   MUSCLE LENGTH: Quads mild limitation B Hip flexors mild limitation B  HS L mild limitation, R moderate limitation Piriformis L mild limitation, R moderate limitation  LLD with R LE less than 1/8 inch longer   POSTURE: rounded shoulders, forward head, decreased lumbar lordosis, and increased thoracic kyphosis  PALPATION:  B lumbar paraspinals in spasm R side of pelvis appears to be rotated forward, consistent with LLD findings    LUMBAR ROM:   AROM eval  Flexion WNL   Extension WNL pain in R SIJ   Right lateral flexion WNL   Left lateral flexion WNL   Right rotation   Left rotation    (Blank rows = not tested)  LOWER EXTREMITY ROM:     Active  Right eval Left eval  Hip flexion    Hip extension    Hip abduction    Hip adduction    Hip internal rotation    Hip external rotation    Knee flexion    Knee extension    Ankle dorsiflexion    Ankle plantarflexion    Ankle inversion    Ankle eversion     (Blank rows = not tested)  LOWER EXTREMITY MMT:    MMT Right eval Left eval  Hip flexion 4+ 4+  Hip extension 3+ 3+  Hip abduction 4 4+  Hip adduction    Hip internal rotation    Hip external rotation    Knee flexion 4 4  Knee extension 4+ 4+  Ankle dorsiflexion 5 5  Ankle plantarflexion    Ankle inversion    Ankle eversion     (Blank rows = not tested)  LUMBAR SPECIAL TESTS:  Straight leg raise test: Negative  FUNCTIONAL TESTS:    GAIT:  TODAY'S TREATMENT:  DATE:  11/11/22:  TherEx  Nustep L6x6 minutes U/LE's  HS stretches supine with stretch out strap 3x30 seconds Rt/Lt Piriformis stretches 3x30 seconds Rt/Lt ITB stretch 3x30 Rt/Lt  Quad stretches 1x30 seconds Rt/Lt  4 part core 10 sec x 10 supine   Manual: Skilled palpation to assess response to manual work and DN STM Rt  posterior hip  Trigger Point Dry-Needling  Treatment instructions: Expect mild to moderate muscle soreness. S/S of pneumothorax if dry needled over a lung field, and to seek immediate medical attention should they occur. Patient verbalized understanding of these instructions and education.  Patient Consent Given: Yes Education handout provided: Yes Muscles treated: Rt piriformis/glut med/glut max Electrical stimulation performed: No Parameters: N/A Treatment response/outcome: decreased palpable tightness    11/03/22  TherEx  Nustep L6x6 minutes BLEs only Checked SI- in good alignment today HS stretches supine with sheet 3x30 seconds B Piriformis stretches 3x30 seconds B Quad stretches 3x30 seconds B   Bridges into red TB x15 with 3 second holds  Sidelying hip ABD red TB x10 B Hip hikes x10 B standing   Eval 11/11/22 Patient reports increased pain in the Rt posterior hip to lateral thigh.  Re-eval shows tightness in Rt piriformis and ITB Tenderness to palpation posterior Rt hip through the piriformis and glut    PATIENT EDUCATION:  Education details: exam findings, POC, HEP  Person educated: Patient Education method: Explanation, Demonstration, and Handouts Education comprehension: verbalized understanding, returned demonstration, and needs further education  HOME EXERCISE PROGRAM: Access Code: [facility] URL: [email or web address] Date: 11/11/2022 Prepared by: Corlis Leak  Exercises - Supine SI Joint Self-Correction  - 1-2 x daily - 7 x weekly - 1 sets - 6 reps - 5 hold - Supine Bridge with Resistance Band  - 2 x daily - 7 x weekly - 1 sets - 10 reps - 3-5 hold - Seated Hamstring Stretch  - 2 x daily - 7 x weekly - 1 sets - 2 reps - 30 hold - Supine Piriformis Stretch with Leg Straight  - 2 x daily - 7 x weekly - 1 sets - 3 reps - 30 sec  hold - Hooklying Hamstring Stretch with Strap  - 2 x daily - 7 x weekly - 1 sets - 3 reps - 30 sec  hold - Supine ITB  Stretch with Strap  - 2 x daily - 7 x weekly - 1 sets - 3 reps - 30 sec  hold - Supine Transversus Abdominis Bracing with Pelvic Floor Contraction  - 2 x daily - 7 x weekly - 1 sets - 10 reps - 10sec  hold  ASSESSMENT:  CLINICAL IMPRESSION:  Patient reports increased pain in the Rt posterior hip to lateral thigh. Re-eval shows tightness in Rt piriformis and ITB. Tenderness to palpation posterior Rt hip through the piriformis and glut med. Trial of DN and manual work to [employer] has had DN before and responded well). Positive response to exercise and manual work/DN through the Rt posterior hip area.    OBJECTIVE IMPAIRMENTS: difficulty walking, decreased ROM, decreased strength, hypomobility, impaired flexibility, postural dysfunction, and pain.   GOALS: Goals reviewed with patient? Yes  SHORT TERM GOALS: Target date: 11/20/2022    Will be compliant with appropriate progressive HEP  Baseline: Goal status: INITIAL  2.  Will be independent with self SI re-alignment techniques  Baseline:  Goal status: INITIAL  3.  Pain to be resolved, 0/10 Baseline:  Goal status: INITIAL   LONG TERM  GOALS: Target date: 12/11/2022    MMT to improve by at least 1 grade in all weak groups  Baseline:  Goal status: INITIAL  2.  Muscle flexibility impairments to have resolved  Baseline:  Goal status: INITIAL  3.  Will be independent in advanced HEP vs gym program with focus on preventing recurrence of SI misalignment/pain  Baseline:  Goal status: INITIAL   PLAN:  PT FREQUENCY: 2x/week  PT DURATION: 6 weeks  PLANNED INTERVENTIONS: Therapeutic exercises, Therapeutic activity, Neuromuscular re-education, Balance training, Gait training, Patient/Family education, Self Care, Joint mobilization, Stair training, Aquatic Therapy, Electrical stimulation, Spinal mobilization, Cryotherapy, Moist heat, Taping, Ultrasound, Ionotophoresis 4mg /ml Dexamethasone, Manual therapy, and  Re-evaluation.  PLAN FOR NEXT SESSION: check SI alignment, address PRN. Otherwise flexibility and general/core strength as appropriate. HEP updates.   Annaleise Burger P. Leonor Liv PT, MPH 11/11/22 2:45 PM

## 2022-11-12 ENCOUNTER — Encounter: Payer: Self-pay | Admitting: Rehabilitative and Restorative Service Providers"

## 2022-11-12 ENCOUNTER — Ambulatory Visit: Payer: Medicare Other | Admitting: Rehabilitative and Restorative Service Providers"

## 2022-11-12 DIAGNOSIS — M6281 Muscle weakness (generalized): Secondary | ICD-10-CM

## 2022-11-12 DIAGNOSIS — R29898 Other symptoms and signs involving the musculoskeletal system: Secondary | ICD-10-CM

## 2022-11-12 DIAGNOSIS — M5459 Other low back pain: Secondary | ICD-10-CM | POA: Diagnosis not present

## 2022-11-12 NOTE — Therapy (Addendum)
 OUTPATIENT PHYSICAL THERAPY THORACOLUMBAR TREATMENT AND DISCHARGE SUMMARY  PHYSICAL THERAPY DISCHARGE SUMMARY  Visits from Start of Care: 4  Current functional level related to goals / functional outcomes: See progress note for discharge status    Remaining deficits: Unknown    Education / Equipment: HEP    Patient agrees to discharge. Patient goals were not met. Patient is being discharged due to not returning since the last visit.  Inga Noller P. Leonor Liv PT, MPH 09/24/23 2:13 PM   Patient Name: PANAYIOTA LARKIN MRN: [id number] DOB:1946-03-23, 77 y.o., female Today's Date: 11/12/2022  END OF SESSION:  PT End of Session - 11/12/22 0846     Visit Number 4    Number of Visits 13    Date for PT Re-Evaluation 12/11/22    Authorization Type UHC MCR    Authorization Time Period 10/30/22 to 12/11/22    Progress Note Due on Visit 10    PT Start Time 0845    PT Stop Time 0933    PT Time Calculation (min) 48 min              History reviewed. No pertinent past medical history. History reviewed. No pertinent surgical history. Patient Active Problem List   Diagnosis Date Noted   Sesamoiditis of left foot 04/15/2022   Primary osteoarthritis of left foot 12/25/2020   Lumbosacral spondylosis with SI joint dysfunction, right 06/03/2019   Primary osteoarthritis of right knee 06/16/2017   Prepatellar bursitis, right knee 05/15/2017   Ganglion cyst of right foot 05/15/2017   Plantar fasciitis, bilateral 05/15/2017    PCP: Cathrine Muster, B MD   REFERRING PROVIDER: Monica Becton, MD  REFERRING DIAG: [phone] (ICD-10-CM) - Other osteoarthritis of spine, lumbosacral region  Rationale for Evaluation and Treatment: Rehabilitation  THERAPY DIAG:  Other low back pain  Muscle weakness (generalized)  Other symptoms and signs involving the musculoskeletal system  ONSET DATE: 10/15/2022  SUBJECTIVE:                                                                                                                                                                                            SUBJECTIVE STATEMENT:  She has no pain in the Rt SI area and is sore in the Rt posterior hip into thigh but not painful. She has had some type pain in the LB/Rt hip in the past ~ 5 years. She is working on exercises at home.   PERTINENT HISTORY:  Lumbosacral spondylosis with SI joint dysfunction, right Pleasant 77 year old female, we have treated her in the past for lumbar radiculitis, more recently she is having increasing pain right low  back and localized at the sacroiliac joint worse with walking, occasional popping sensations, hip exam is normal, she did have discrete pain directly over the right sacroiliac joint, no pain midline or paralumbar musculature. We discussed the anatomy, pathophysiology, will do x-rays of the lumbar spine and SI joints, adding 5 days of prednisone, formal PT, return to see me in 6 weeks, SI joint injection if not better.  PAIN:  Are you having pain? Yes: NPRS scale: 0/10; 3/10 pain at worst  Pain location: R SI Pain description: "I just know its there, its always just there"  Aggravating factors: has a mind of its own now  Relieving factors: exercises  PRECAUTIONS: None  WEIGHT BEARING RESTRICTIONS: No  FALLS:  Has patient fallen in last 6 months? No  LIVING ENVIRONMENT: Lives with: lives with their spouse Lives in: House/apartment Stairs: 3 STE no rails, 3 story home with rails  Has following equipment at home: None  OCCUPATION: retired   PLOF: Independent, Independent with basic ADLs, Independent with gait, and Independent with transfers  PATIENT GOALS: be pain free   NEXT MD VISIT: Dr. Karie Schwalbe May 8th  OBJECTIVE:   DIAGNOSTIC FINDINGS:  FINDINGS: The sacroiliac joint spaces are maintained and there is no evidence of arthropathy. No other bone abnormalities are seen.   IMPRESSION: Negative.  FINDINGS: Mild leftward curvature of the  mid lumbar spine. Preservation of the vertebral body heights. Degenerative disc disease most pronounced L4-5 and L5-S1. Lower lumbar spine facet degenerative changes. Lower thoracic spine degenerative changes.   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  PATIENT SURVEYS:  FOTO will do 2nd visit   SENSATION: Some altered sensation anterior R thigh, "its different, more like a sore muscle I've not used in awhile"   MUSCLE LENGTH: Quads mild limitation B Hip flexors mild limitation B  HS L mild limitation, R moderate limitation Piriformis L mild limitation, R moderate limitation  LLD with R LE less than 1/8 inch longer   POSTURE: rounded shoulders, forward head, decreased lumbar lordosis, and increased thoracic kyphosis  PALPATION:  B lumbar paraspinals in spasm R side of pelvis appears to be rotated forward, consistent with LLD findings  11/11/22: Re-eval shows tightness in Rt piriformis and ITB. Tenderness to palpation posterior Rt hip through the piriformis and glut med. Trial of DN and manual work to [employer] tightness(patient has had DN before and responded well)  LUMBAR ROM:   AROM eval  Flexion WNL   Extension WNL pain in R SIJ   Right lateral flexion WNL   Left lateral flexion WNL   Right rotation   Left rotation    (Blank rows = not tested)  LOWER EXTREMITY ROM:     Active  Right eval Left eval  Hip flexion    Hip extension    Hip abduction    Hip adduction    Hip internal rotation    Hip external rotation    Knee flexion    Knee extension    Ankle dorsiflexion    Ankle plantarflexion    Ankle inversion    Ankle eversion     (Blank rows = not tested)  LOWER EXTREMITY MMT:    MMT Right eval Left eval  Hip flexion 4+ 4+  Hip extension 3+ 3+  Hip abduction 4 4+  Hip adduction    Hip internal rotation    Hip external rotation    Knee flexion 4 4  Knee extension 4+ 4+  Ankle dorsiflexion  5 5  Ankle plantarflexion     Ankle inversion    Ankle eversion     (Blank rows = not tested)  LUMBAR SPECIAL TESTS:  Straight leg raise test: Negative  Eval  11/11/22  Re-eval shows tightness in Rt piriformis and ITB Tenderness to palpation posterior Rt hip through the piriformis and glut   TODAY'S TREATMENT:                                                                                                                              DATE:  11/12/22:  TherEx Nustep L7x6 minutes U/LE's  HS stretches supine with stretch out strap 3x30 seconds Rt/Lt Piriformis stretches 3x30 seconds Rt/Lt ITB stretch 3x30 Rt/Lt  Quad stretches 1x30 seconds Rt/Lt  4 part core 10 sec x 10 supine  Hip abbuction with ball btn knees hooklying 3 sec x 5 Bridge with hip adduction with ball 5 sec x 10  Alternating hip abduction green TB 3 sec x 10  Row blue TB 3 sec x 10  Shoulder extension blue TB x 3 sec x 10  Antirotation blue TB 3 sec x 10 Rt/Lt    Manual: Skilled palpation to assess response to manual work and DN 11/11/22 STM Rt posterior hip    11/11/22: TherEx  Nustep L6x6 minutes U/LE's  HS stretches supine with stretch out strap 3x30 seconds Rt/Lt Piriformis stretches 3x30 seconds Rt/Lt ITB stretch 3x30 Rt/Lt  Quad stretches 1x30 seconds Rt/Lt  4 part core 10 sec x 10 supine   Manual: Skilled palpation to assess response to manual work and DN STM Rt posterior hip  Trigger Point Dry-Needling  Treatment instructions: Expect mild to moderate muscle soreness. S/S of pneumothorax if dry needled over a lung field, and to seek immediate medical attention should they occur. Patient verbalized understanding of these instructions and education.  Patient Consent Given: Yes Education handout provided: Yes Muscles treated: Rt piriformis/glut med/glut max Electrical stimulation performed: No Parameters: N/A Treatment response/outcome: decreased palpable tightness    11/03/22  TherEx  Nustep L6x6 minutes BLEs  only Checked SI- in good alignment today HS stretches supine with sheet 3x30 seconds B Piriformis stretches 3x30 seconds B Quad stretches 3x30 seconds B   Bridges into red TB x15 with 3 second holds  Sidelying hip ABD red TB x10 B Hip hikes x10 B standing   PATIENT EDUCATION:  Education details: exam findings, POC, HEP  Person educated: Patient Education method: Programmer, multimedia, Demonstration, and Handouts Education comprehension: verbalized understanding, returned demonstration, and needs further education  HOME EXERCISE PROGRAM: Access Code: [facility] URL: [email or web address] Date: 11/12/2022 Prepared by: Corlis Leak  Exercises - Supine SI Joint Self-Correction  - 1-2 x daily - 7 x weekly - 1 sets - 6 reps - 5 hold - Supine Bridge with Resistance Band  - 2 x daily - 7 x weekly - 1 sets - 10 reps - 3-5 hold - Seated Hamstring Stretch  -  2 x daily - 7 x weekly - 1 sets - 2 reps - 30 hold - Supine Piriformis Stretch with Leg Straight  - 2 x daily - 7 x weekly - 1 sets - 3 reps - 30 sec  hold - Hooklying Hamstring Stretch with Strap  - 2 x daily - 7 x weekly - 1 sets - 3 reps - 30 sec  hold - Supine ITB Stretch with Strap  - 2 x daily - 7 x weekly - 1 sets - 3 reps - 30 sec  hold - Supine Transversus Abdominis Bracing with Pelvic Floor Contraction  - 2 x daily - 7 x weekly - 1 sets - 10 reps - 10sec  hold - Supine Bridge with Mini Swiss Ball Between Knees  - 1 x daily - 7 x weekly - 1-2 sets - 10 reps - 5 sec  hold - Hooklying Clamshell with Resistance  - 1 x daily - 7 x weekly - 1-2 sets - 10 reps - 3 sec  hold - Standing Bilateral Low Shoulder Row with Anchored Resistance  - 2 x daily - 7 x weekly - 1-3 sets - 10 reps - 2-3 sec  hold - Shoulder extension with resistance - Neutral  - 1 x daily - 7 x weekly - 1-2 sets - 10 reps - 3-5 sec  hold - Anti-Rotation Lateral Stepping with Press  - 2 x daily - 7 x weekly - 1-2 sets - 10 reps - 2-3 sec  hold  ASSESSMENT:  CLINICAL  IMPRESSION:  Patient reports good response to treatment yesterday with resolution of pain in the Rt posterior hip to lateral thigh. She does have some residual soreness in the area of DN but no pain in the hip or thigh. Positive response to exercise and manual work/DN through the Rt posterior hip area. Continue treatment with focus on core stabilization and strengthening. Excellent progress. Patient requests that she continue with independent HEP and will call is she has problems or needs to re-schedule.    OBJECTIVE IMPAIRMENTS: tightness in Rt piriformis and ITB. Tenderness to palpation posterior Rt hip through the piriformis and glut med   GOALS: Goals reviewed with patient? Yes  SHORT TERM GOALS: Target date: 11/20/2022    Will be compliant with appropriate progressive HEP  Baseline: Goal status: INITIAL  2.  Will be independent with self SI re-alignment techniques  Baseline:  Goal status: INITIAL  3.  Pain to be resolved, 0/10 Baseline:  Goal status: INITIAL   LONG TERM GOALS: Target date: 12/11/2022    MMT to improve by at least 1 grade in all weak groups  Baseline:  Goal status: INITIAL  2.  Muscle flexibility impairments to have resolved  Baseline:  Goal status: INITIAL  3.  Will be independent in advanced HEP vs gym program with focus on preventing recurrence of SI misalignment/pain  Baseline:  Goal status: INITIAL   PLAN:  PT FREQUENCY: 2x/week  PT DURATION: 6 weeks  PLANNED INTERVENTIONS: Therapeutic exercises, Therapeutic activity, Neuromuscular re-education, Balance training, Gait training, Patient/Family education, Self Care, Joint mobilization, Stair training, Aquatic Therapy, Electrical stimulation, Spinal mobilization, Cryotherapy, Moist heat, Taping, Ultrasound, Ionotophoresis 4mg /ml Dexamethasone, Manual therapy, and Re-evaluation.  PLAN FOR NEXT SESSION: check SI alignment, address PRN. Otherwise flexibility and general/core strength as appropriate.  HEP updates.   Nareg Breighner P. Leonor Liv PT, MPH 11/12/22 8:46 AM

## 2022-11-26 ENCOUNTER — Ambulatory Visit (INDEPENDENT_AMBULATORY_CARE_PROVIDER_SITE_OTHER): Payer: Medicare Other | Admitting: Sports Medicine

## 2022-11-26 DIAGNOSIS — M47817 Spondylosis without myelopathy or radiculopathy, lumbosacral region: Secondary | ICD-10-CM

## 2022-11-26 NOTE — Progress Notes (Signed)
    Procedures performed today:    None.  Independent interpretation of notes and tests performed by another provider:   None.  Brief History, Exam, Impression, and Recommendations:    Lumbosacral spondylosis with SI joint dysfunction, right Sonnet returns, she is a very pleasant 77 year old female, we have treated her in the past for lumbar radiculitis, more recently she was having increasing pain right-sided low back localized at the SI joint worse with walking, occasional popping with a normal hip exam. She had discrete pain over the SI joint. We added prednisone, formal physical therapy. She has improved dramatically, she will continue the home conditioning and she can return to see me as needed.    ____________________________________________ Ihor Austin. Benjamin Stain, M.D., ABFM., CAQSM., AME. Primary Care and Sports Medicine [facility]  Adjunct Professor of Family Medicine  [employer] [place] [employer]  Restaurant manager, fast food

## 2022-11-26 NOTE — Assessment & Plan Note (Signed)
Mariah Montgomery returns, she is a very pleasant 77 year old female, we have treated her in the past for lumbar radiculitis, more recently she was having increasing pain right-sided low back localized at the SI joint worse with walking, occasional popping with a normal hip exam. She had discrete pain over the SI joint. We added prednisone, formal physical therapy. She has improved dramatically, she will continue the home conditioning and she can return to see me as needed.

## 2024-01-04 ENCOUNTER — Ambulatory Visit

## 2024-01-04 ENCOUNTER — Ambulatory Visit (INDEPENDENT_AMBULATORY_CARE_PROVIDER_SITE_OTHER): Admitting: Sports Medicine

## 2024-01-04 ENCOUNTER — Encounter: Payer: Self-pay | Admitting: Sports Medicine

## 2024-01-04 ENCOUNTER — Ambulatory Visit: Payer: Self-pay | Admitting: Sports Medicine

## 2024-01-04 DIAGNOSIS — Z0489 Encounter for examination and observation for other specified reasons: Secondary | ICD-10-CM | POA: Diagnosis not present

## 2024-01-04 DIAGNOSIS — M25562 Pain in left knee: Secondary | ICD-10-CM

## 2024-01-04 DIAGNOSIS — M1712 Unilateral primary osteoarthritis, left knee: Secondary | ICD-10-CM | POA: Insufficient documentation

## 2024-01-04 MED ORDER — OXYCODONE-ACETAMINOPHEN 10-325 MG PO TABS
1.0000 | ORAL_TABLET | Freq: Three times a day (TID) | ORAL | 0 refills | Status: DC | PRN
Start: 2024-01-04 — End: 2024-02-16

## 2024-01-04 NOTE — Assessment & Plan Note (Signed)
 Pleasant 78 year old female, she does have baseline osteoarthritis, yesterday she was playing with her dog, she fell awkwardly twisting her left knee, she had immediate pain, swelling lateral aspect of the left knee, she had great difficulty bearing weight. Since then she has had persistent pain, on exam she has visible and palpable effusion, ligamentous structures are stable, she does have pain with terminal flexion, she also has discrete tenderness to the lateral head of the gastrocnemius as well as posterior/lateral joint line. Strength is good. Suspect some degree of internal derangement such as a lateral meniscal tear. Bony pathology and fracture is also possible, she is getting some botanical symptoms such as catching, locking so we will proceed with x-ray and MRI, Percocet for pain. Hinged knee brace. Return to see me 2 weeks.

## 2024-01-04 NOTE — Progress Notes (Signed)
    Procedures performed today:    None.  Independent interpretation of notes and tests performed by another provider:   None.  Brief History, Exam, Impression, and Recommendations:    Left lateral knee pain Pleasant 78 year old female, she does have baseline osteoarthritis, yesterday she was playing with her dog, she fell awkwardly twisting her left knee, she had immediate pain, swelling lateral aspect of the left knee, she had great difficulty bearing weight. Since then she has had persistent pain, on exam she has visible and palpable effusion, ligamentous structures are stable, she does have pain with terminal flexion, she also has discrete tenderness to the lateral head of the gastrocnemius as well as posterior/lateral joint line. Strength is good. Suspect some degree of internal derangement such as a lateral meniscal tear. Bony pathology and fracture is also possible, she is getting some botanical symptoms such as catching, locking so we will proceed with x-ray and MRI, Percocet for pain. Hinged knee brace. Return to see me 2 weeks.    ____________________________________________ Joselyn Nicely. Sandy Crumb, M.D., ABFM., CAQSM., AME. Primary Care and Sports Medicine [facility]  Adjunct Professor of [facility]  [employer]  Restaurant manager, fast food

## 2024-01-05 ENCOUNTER — Ambulatory Visit (INDEPENDENT_AMBULATORY_CARE_PROVIDER_SITE_OTHER): Admitting: Sports Medicine

## 2024-01-05 ENCOUNTER — Other Ambulatory Visit (INDEPENDENT_AMBULATORY_CARE_PROVIDER_SITE_OTHER)

## 2024-01-05 DIAGNOSIS — M25562 Pain in left knee: Secondary | ICD-10-CM | POA: Diagnosis not present

## 2024-01-05 MED ORDER — TRIAMCINOLONE ACETONIDE 40 MG/ML IJ SUSP
40.0000 mg | Freq: Once | INTRAMUSCULAR | Status: AC
  Administered 2024-01-05: 40 mg via INTRAMUSCULAR

## 2024-01-05 NOTE — Progress Notes (Signed)
    Procedures performed today:    Procedure: Real-time Ultrasound Guided aspiration/injection of the left knee Device: Samsung HS60  Verbal informed consent obtained.  Time-out conducted.  Noted no overlying erythema, induration, or other signs of local infection.  Skin prepped in a sterile fashion.  Local anesthesia: Topical Ethyl chloride.  With sterile technique and under real time ultrasound guidance: Aspirated 14 mL of clear, straw-colored fluid, syringe switched and 1 cc Kenalog  40, 2 cc lidocaine, 2 cc bupivacaine injected easily Completed without difficulty  Advised to call if fevers/chills, erythema, induration, drainage, or persistent bleeding.  Images permanently stored and available for review in PACS.  Impression: Technically successful ultrasound guided aspiration/injection.  Independent interpretation of notes and tests performed by another provider:   None.  Brief History, Exam, Impression, and Recommendations:    Left lateral knee pain Pleasant 78 year old female returns, baseline osteoarthritis, stable for yesterday playing with her dog, fell, great difficulty bearing weight, severe pain posterior. Ultimately we obtained an MRI that did show a ruptured Baker's cyst as well as internal derangement in the form of medial meniscal tearing, posterior horn. Osteoarthritis. We braced her, put her in crutches, today we aspirated and injected her knee, return to see me in 6 weeks.    ____________________________________________ Joselyn Nicely. Sandy Crumb, M.D., ABFM., CAQSM., AME. Primary Care and Sports Medicine [facility]  Adjunct Professor of [facility] Medicine  [employer]  Restaurant manager, fast food

## 2024-01-05 NOTE — Assessment & Plan Note (Addendum)
 Pleasant 78 year old female returns, baseline osteoarthritis, stable for yesterday playing with her dog, fell, great difficulty bearing weight, severe pain posterior. Ultimately we obtained an MRI that did show a ruptured Baker's cyst as well as internal derangement in the form of medial meniscal tearing, posterior horn. Osteoarthritis. We braced her, put her in crutches, today we aspirated and injected her knee, return to see me in 6 weeks.

## 2024-01-18 ENCOUNTER — Ambulatory Visit: Admitting: Sports Medicine

## 2024-01-18 ENCOUNTER — Encounter: Payer: Self-pay | Admitting: Sports Medicine

## 2024-02-16 ENCOUNTER — Telehealth: Payer: Self-pay | Admitting: Sports Medicine

## 2024-02-16 ENCOUNTER — Encounter: Payer: Self-pay | Admitting: Sports Medicine

## 2024-02-16 ENCOUNTER — Ambulatory Visit (INDEPENDENT_AMBULATORY_CARE_PROVIDER_SITE_OTHER): Admitting: Sports Medicine

## 2024-02-16 DIAGNOSIS — M1712 Unilateral primary osteoarthritis, left knee: Secondary | ICD-10-CM

## 2024-02-16 NOTE — Progress Notes (Signed)
    Procedures performed today:    None.  Independent interpretation of notes and tests performed by another provider:   None.  Brief History, Exam, Impression, and Recommendations:    Primary osteoarthritis of left knee Mariah Montgomery returns, she is a very pleasant 78 year old female, we saw her about 6 weeks ago for left lateral knee pain, she was playing with her dog, fell and then had great difficulty bearing weight afterwards. Ultimately an MRI showed a ruptured Baker's cyst, as well as meniscal tearing and osteoarthritis. We put her in a brace, crutches, aspirated and injected her knee. Today she is doing a lot better, she has a bit of discomfort medially when walking. She is eager to get out of the brace. We will switch her to a knee sleeve, she will do arthritis Tylenol  3 times daily, home physical therapy given. We will also work on getting her approved for viscosupplementation, and she can schedule with us  if the above is not effective.    ____________________________________________ Debby PARAS. Curtis, M.D., ABFM., CAQSM., AME. Primary Care and Sports Medicine  [facility]  Adjunct Professor of [facility] Medicine  [employer]  Restaurant manager, fast food

## 2024-02-16 NOTE — Telephone Encounter (Signed)
My[employer] paperwork faxed to [employer] at [phone] ?Request is for Orthovisc ?Fax confirmation receipt received  ?

## 2024-02-16 NOTE — Telephone Encounter (Signed)
 [facility] approval please, bilateral x-ray confirmed osteoarthritis, failed over 6 weeks of physician directed physical therapy, steroid injections, analgesics.

## 2024-02-16 NOTE — Assessment & Plan Note (Signed)
 Dickey returns, she is a very pleasant 78 year old female, we saw her about 6 weeks ago for left lateral knee pain, she was playing with her dog, fell and then had great difficulty bearing weight afterwards. Ultimately an MRI showed a ruptured Baker's cyst, as well as meniscal tearing and osteoarthritis. We put her in a brace, crutches, aspirated and injected her knee. Today she is doing a lot better, she has a bit of discomfort medially when walking. She is eager to get out of the brace. We will switch her to a knee sleeve, she will do arthritis Tylenol  3 times daily, home physical therapy given. We will also work on getting her approved for viscosupplementation, and she can schedule with us  if the above is not effective.

## 2024-02-16 NOTE — Patient Instructions (Signed)
 SABRA

## 2024-02-17 NOTE — Telephone Encounter (Addendum)
 Benefits Investigation Details received from [employer] Injection: Orthovisc/Monovisc PA required: Yes May fill through: Buy and Bill OV Copay/Coinsurance: $0 Product Copay: 20% Administration Coinsurance: 20% Administration Copay: $ Deductible: $  Deductible does not apply. Once the OOP has been met, the patient is covered at 100%. Only one copay applies per date of service.  Prior Authorization is required for the drug through [employer].   Spoke with [employer]. Insurance mentioned Synvisc is the preferred form of specialty drug that does not require a PA. Patient does have a copay of $166 per gel injection.  Patient has been advised of update and has informed us  she would like to hold off on the gel injections for now. She will be reaching out to us  when she is ready to begin the series. Patient states she is doing well for now.

## 2024-03-22 ENCOUNTER — Encounter: Payer: Self-pay | Admitting: Sports Medicine
